# Patient Record
Sex: Female | Born: 1940 | Race: White | Hispanic: No | Marital: Married | Smoking: Never smoker
Health system: Southern US, Community
[De-identification: ages and names within clinical notes are randomized; demographics above are authoritative.]

## PROBLEM LIST (undated history)

## (undated) DIAGNOSIS — E785 Hyperlipidemia, unspecified: Secondary | ICD-10-CM

## (undated) DIAGNOSIS — T753XXA Motion sickness, initial encounter: Secondary | ICD-10-CM

## (undated) DIAGNOSIS — C801 Malignant (primary) neoplasm, unspecified: Secondary | ICD-10-CM

## (undated) DIAGNOSIS — F0280 Dementia in other diseases classified elsewhere without behavioral disturbance: Secondary | ICD-10-CM

## (undated) DIAGNOSIS — F0390 Unspecified dementia without behavioral disturbance: Secondary | ICD-10-CM

## (undated) DIAGNOSIS — E871 Hypo-osmolality and hyponatremia: Secondary | ICD-10-CM

## (undated) DIAGNOSIS — M1990 Unspecified osteoarthritis, unspecified site: Secondary | ICD-10-CM

## (undated) DIAGNOSIS — G250 Essential tremor: Secondary | ICD-10-CM

## (undated) DIAGNOSIS — I10 Essential (primary) hypertension: Secondary | ICD-10-CM

## (undated) DIAGNOSIS — M419 Scoliosis, unspecified: Secondary | ICD-10-CM

## (undated) HISTORY — PX: FOOT SURGERY: SHX648

## (undated) HISTORY — PX: COLONOSCOPY: SHX174

---

## 2004-08-11 ENCOUNTER — Ambulatory Visit: Payer: Self-pay | Admitting: Family Medicine

## 2005-11-12 ENCOUNTER — Ambulatory Visit: Payer: Self-pay | Admitting: Family Medicine

## 2005-12-08 ENCOUNTER — Ambulatory Visit: Payer: Self-pay | Admitting: Gastroenterology

## 2006-11-18 ENCOUNTER — Ambulatory Visit: Payer: Self-pay | Admitting: Family Medicine

## 2007-11-21 ENCOUNTER — Ambulatory Visit: Payer: Self-pay | Admitting: Family Medicine

## 2008-12-06 ENCOUNTER — Ambulatory Visit: Payer: Self-pay | Admitting: Family Medicine

## 2009-12-12 ENCOUNTER — Ambulatory Visit: Payer: Self-pay | Admitting: Family Medicine

## 2010-12-16 ENCOUNTER — Ambulatory Visit: Payer: Self-pay

## 2011-12-17 ENCOUNTER — Ambulatory Visit: Payer: Self-pay | Admitting: Family Medicine

## 2012-07-20 ENCOUNTER — Ambulatory Visit: Payer: Self-pay | Admitting: Ophthalmology

## 2012-07-20 HISTORY — PX: CATARACT EXTRACTION W/ INTRAOCULAR LENS IMPLANT: SHX1309

## 2013-05-10 ENCOUNTER — Ambulatory Visit: Payer: Self-pay | Admitting: Internal Medicine

## 2013-11-09 ENCOUNTER — Ambulatory Visit: Payer: Self-pay | Admitting: Family Medicine

## 2013-11-17 ENCOUNTER — Ambulatory Visit: Payer: Self-pay | Admitting: Family Medicine

## 2013-12-19 ENCOUNTER — Ambulatory Visit: Payer: Self-pay | Admitting: Family Medicine

## 2019-12-14 ENCOUNTER — Other Ambulatory Visit: Payer: Self-pay | Admitting: Family Medicine

## 2019-12-14 DIAGNOSIS — M85852 Other specified disorders of bone density and structure, left thigh: Secondary | ICD-10-CM

## 2020-01-23 ENCOUNTER — Encounter: Payer: Self-pay | Admitting: Ophthalmology

## 2020-01-23 ENCOUNTER — Other Ambulatory Visit: Payer: Self-pay

## 2020-01-29 ENCOUNTER — Other Ambulatory Visit: Admission: RE | Admit: 2020-01-29 | Payer: Medicare Other | Source: Ambulatory Visit

## 2020-01-29 NOTE — Discharge Instructions (Signed)

## 2020-01-31 ENCOUNTER — Ambulatory Visit: Payer: Medicare Other | Admitting: Anesthesiology

## 2020-01-31 ENCOUNTER — Encounter
Admission: RE | Disposition: A | Discharge status: Home / Self Care | Payer: Self-pay | Source: Home / Self Care | Attending: Ophthalmology

## 2020-01-31 ENCOUNTER — Encounter: Payer: Self-pay | Admitting: Ophthalmology

## 2020-01-31 ENCOUNTER — Ambulatory Visit
Admission: RE | Admit: 2020-01-31 | Discharge: 2020-01-31 | Disposition: A | Discharge status: Home / Self Care | Payer: Medicare Other | Attending: Ophthalmology | Admitting: Ophthalmology

## 2020-01-31 ENCOUNTER — Other Ambulatory Visit: Payer: Self-pay

## 2020-01-31 DIAGNOSIS — Z79899 Other long term (current) drug therapy: Secondary | ICD-10-CM | POA: Insufficient documentation

## 2020-01-31 DIAGNOSIS — I10 Essential (primary) hypertension: Secondary | ICD-10-CM | POA: Diagnosis not present

## 2020-01-31 DIAGNOSIS — H2511 Age-related nuclear cataract, right eye: Secondary | ICD-10-CM | POA: Insufficient documentation

## 2020-01-31 DIAGNOSIS — Z7982 Long term (current) use of aspirin: Secondary | ICD-10-CM | POA: Diagnosis not present

## 2020-01-31 DIAGNOSIS — M810 Age-related osteoporosis without current pathological fracture: Secondary | ICD-10-CM | POA: Insufficient documentation

## 2020-01-31 DIAGNOSIS — E7800 Pure hypercholesterolemia, unspecified: Secondary | ICD-10-CM | POA: Diagnosis not present

## 2020-01-31 HISTORY — DX: Scoliosis, unspecified: M41.9

## 2020-01-31 HISTORY — DX: Essential (primary) hypertension: I10

## 2020-01-31 HISTORY — DX: Hyperlipidemia, unspecified: E78.5

## 2020-01-31 HISTORY — DX: Motion sickness, initial encounter: T75.3XXA

## 2020-01-31 HISTORY — DX: Essential tremor: G25.0

## 2020-01-31 HISTORY — PX: CATARACT EXTRACTION W/PHACO: SHX586

## 2020-01-31 SURGERY — PHACOEMULSIFICATION, CATARACT, WITH IOL INSERTION
Anesthesia: Monitor Anesthesia Care | Site: Eye | Laterality: Right

## 2020-01-31 MED ORDER — EPINEPHRINE PF 1 MG/ML IJ SOLN
INTRAOCULAR | Status: DC | PRN
  Administered 2020-01-31: 62 mL via OPHTHALMIC

## 2020-01-31 MED ORDER — MIDAZOLAM HCL 2 MG/2ML IJ SOLN
INTRAMUSCULAR | Status: DC | PRN
  Administered 2020-01-31 (×2): .5 mg via INTRAVENOUS

## 2020-01-31 MED ORDER — BRIMONIDINE TARTRATE-TIMOLOL 0.2-0.5 % OP SOLN
OPHTHALMIC | Status: DC | PRN
  Administered 2020-01-31: 1 [drp] via OPHTHALMIC

## 2020-01-31 MED ORDER — TETRACAINE HCL 0.5 % OP SOLN
1.0000 [drp] | OPHTHALMIC | Status: DC | PRN
  Administered 2020-01-31 (×3): 1 [drp] via OPHTHALMIC

## 2020-01-31 MED ORDER — CEFUROXIME OPHTHALMIC INJECTION 1 MG/0.1 ML
INJECTION | OPHTHALMIC | Status: DC | PRN
  Administered 2020-01-31: 0.1 mL via INTRACAMERAL

## 2020-01-31 MED ORDER — ARMC OPHTHALMIC DILATING DROPS
1.0000 "application " | OPHTHALMIC | Status: DC | PRN
  Administered 2020-01-31 (×3): 1 via OPHTHALMIC

## 2020-01-31 MED ORDER — LIDOCAINE HCL (PF) 2 % IJ SOLN
INTRAOCULAR | Status: DC | PRN
  Administered 2020-01-31: 2 mL

## 2020-01-31 MED ORDER — MOXIFLOXACIN HCL 0.5 % OP SOLN
1.0000 [drp] | OPHTHALMIC | Status: DC | PRN
  Administered 2020-01-31 (×3): 1 [drp] via OPHTHALMIC

## 2020-01-31 MED ORDER — FENTANYL CITRATE (PF) 100 MCG/2ML IJ SOLN
INTRAMUSCULAR | Status: DC | PRN
  Administered 2020-01-31: 50 ug via INTRAVENOUS

## 2020-01-31 MED ORDER — NA HYALUR & NA CHOND-NA HYALUR 0.4-0.35 ML IO KIT
PACK | INTRAOCULAR | Status: DC | PRN
  Administered 2020-01-31: 1 mL via INTRAOCULAR

## 2020-01-31 SURGICAL SUPPLY — 23 items
CANNULA ANT/CHMB 27G (MISCELLANEOUS) ×1 IMPLANT
CANNULA ANT/CHMB 27GA (MISCELLANEOUS) ×3 IMPLANT
GLOVE SURG LX 7.5 STRW (GLOVE) ×2
GLOVE SURG LX STRL 7.5 STRW (GLOVE) ×1 IMPLANT
GLOVE SURG TRIUMPH 8.0 PF LTX (GLOVE) ×3 IMPLANT
GOWN STRL REUS W/ TWL LRG LVL3 (GOWN DISPOSABLE) ×2 IMPLANT
GOWN STRL REUS W/TWL LRG LVL3 (GOWN DISPOSABLE) ×4
LENS IOL DIOP 21.0 (Intraocular Lens) ×3 IMPLANT
LENS IOL TECNIS MONO 21.0 (Intraocular Lens) IMPLANT
MARKER SKIN DUAL TIP RULER LAB (MISCELLANEOUS) ×3 IMPLANT
NDL CAPSULORHEX 25GA (NEEDLE) ×1 IMPLANT
NDL FILTER BLUNT 18X1 1/2 (NEEDLE) ×2 IMPLANT
NEEDLE CAPSULORHEX 25GA (NEEDLE) ×3 IMPLANT
NEEDLE FILTER BLUNT 18X 1/2SAF (NEEDLE) ×4
NEEDLE FILTER BLUNT 18X1 1/2 (NEEDLE) ×2 IMPLANT
PACK CATARACT BRASINGTON (MISCELLANEOUS) ×3 IMPLANT
PACK EYE AFTER SURG (MISCELLANEOUS) ×3 IMPLANT
PACK OPTHALMIC (MISCELLANEOUS) ×3 IMPLANT
SOLUTION OPHTHALMIC SALT (MISCELLANEOUS) ×3 IMPLANT
SYR 3ML LL SCALE MARK (SYRINGE) ×6 IMPLANT
SYR TB 1ML LUER SLIP (SYRINGE) ×3 IMPLANT
WATER STERILE IRR 250ML POUR (IV SOLUTION) ×3 IMPLANT
WIPE NON LINTING 3.25X3.25 (MISCELLANEOUS) ×3 IMPLANT

## 2020-01-31 NOTE — Op Note (Signed)
LOCATION:  [facility]   PREOPERATIVE DIAGNOSIS:    Nuclear sclerotic cataract right eye. H25.11   POSTOPERATIVE DIAGNOSIS:  Nuclear sclerotic cataract right eye.     PROCEDURE:  Phacoemusification with posterior chamber intraocular lens placement of the right eye   ULTRASOUND TIME: Procedure(s): CATARACT EXTRACTION PHACO AND INTRAOCULAR LENS PLACEMENT (IOC) RIGHT 5.76 00:50.2 11.5% (Right)  LENS:   Implant Name Type Inv. Item Serial No. Manufacturer Lot No. LRB No. Used Action  LENS IOL DIOP 21.0 - [id number] Intraocular Lens LENS IOL DIOP 21.0 [id number] [employer]  Right 1 Implanted         SURGEON:  Deirdre Evener, MD   ANESTHESIA:  Topical with tetracaine drops and 2% Xylocaine jelly, augmented with 1% preservative-free intracameral lidocaine.    COMPLICATIONS:  None.   DESCRIPTION OF PROCEDURE:  The patient was identified in the holding room and transported to the operating room and placed in the supine position under the operating microscope.  The right eye was identified as the operative eye and it was prepped and draped in the usual sterile ophthalmic fashion.   A 1 millimeter clear-corneal paracentesis was made at the 12:00 position.  0.5 ml of preservative-free 1% lidocaine was injected into the anterior chamber. The anterior chamber was filled with Viscoat viscoelastic.  A 2.4 millimeter keratome was used to make a near-clear corneal incision at the 9:00 position.  A curvilinear capsulorrhexis was made with a cystotome and capsulorrhexis forceps.  Balanced salt solution was used to hydrodissect and hydrodelineate the nucleus.   Phacoemulsification was then used in stop and chop fashion to remove the lens nucleus and epinucleus.  The remaining cortex was then removed using the irrigation and aspiration handpiece. Provisc was then placed into the capsular bag to distend it for lens placement.  A lens was then injected into the capsular bag.   The remaining viscoelastic was aspirated.   Wounds were hydrated with balanced salt solution.  The anterior chamber was inflated to a physiologic pressure with balanced salt solution.  No wound leaks were noted. Cefuroxime 0.1 ml of a 10mg /ml solution was injected into the anterior chamber for a dose of 1 mg of intracameral antibiotic at the completion of the case.   Timolol and Brimonidine drops were applied to the eye.  The patient was taken to the recovery room in stable condition without complications of anesthesia or surgery.   Nainika Newlun 01/31/2020, 9:33 AM

## 2020-01-31 NOTE — Anesthesia Procedure Notes (Signed)
Procedure Name: MAC Date/Time: 01/31/2020 9:15 AM Performed by: Silvana Newness, CRNA Pre-anesthesia Checklist: Patient identified, Emergency Drugs available, Suction available, Patient being monitored and Timeout performed Patient Re-evaluated:Patient Re-evaluated prior to induction Oxygen Delivery Method: Nasal cannula

## 2020-01-31 NOTE — H&P (Signed)

## 2020-01-31 NOTE — Anesthesia Postprocedure Evaluation (Signed)
Anesthesia Post Note  Patient: Cassie Taylor  Procedure(s) Performed: CATARACT EXTRACTION PHACO AND INTRAOCULAR LENS PLACEMENT (IOC) RIGHT 5.76 00:50.2 11.5% (Right Eye)     Patient location during evaluation: PACU Anesthesia Type: MAC Level of consciousness: awake and alert Pain management: pain level controlled Vital Signs Assessment: post-procedure vital signs reviewed and stable Respiratory status: spontaneous breathing, nonlabored ventilation, respiratory function stable and patient connected to nasal cannula oxygen Cardiovascular status: stable and blood pressure returned to baseline Postop Assessment: no apparent nausea or vomiting Anesthetic complications: no   No complications documented.  Adele Barthel Aalani Aikens

## 2020-01-31 NOTE — Anesthesia Preprocedure Evaluation (Signed)
Anesthesia Evaluation  Patient identified by MRN, date of birth, ID band Patient awake    History of Anesthesia Complications Negative for: history of anesthetic complications  Airway Mallampati: II  TM Distance: >3 FB Neck ROM: Full    Dental no notable dental hx.    Pulmonary neg pulmonary ROS,    Pulmonary exam normal        Cardiovascular hypertension, Normal cardiovascular exam     Neuro/Psych    GI/Hepatic negative GI ROS, Neg liver ROS,   Endo/Other  negative endocrine ROS  Renal/GU negative Renal ROS     Musculoskeletal   Abdominal   Peds  Hematology negative hematology ROS (+)   Anesthesia Other Findings   Reproductive/Obstetrics                            Anesthesia Physical Anesthesia Plan  ASA: II  Anesthesia Plan: MAC   Post-op Pain Management:    Induction:   PONV Risk Score and Plan: 2 and Midazolam, TIVA and Treatment may vary due to age or medical condition  Airway Management Planned: Nasal Cannula and Natural Airway  Additional Equipment: None  Intra-op Plan:   Post-operative Plan:   Informed Consent: I have reviewed the patients History and Physical, chart, labs and discussed the procedure including the risks, benefits and alternatives for the proposed anesthesia with the patient or authorized representative who has indicated his/her understanding and acceptance.       Plan Discussed with: CRNA  Anesthesia Plan Comments:         Anesthesia Quick Evaluation

## 2020-01-31 NOTE — Transfer of Care (Signed)
Immediate Anesthesia Transfer of Care Note  Patient: Cassie Taylor  Procedure(s) Performed: CATARACT EXTRACTION PHACO AND INTRAOCULAR LENS PLACEMENT (IOC) RIGHT MALYUGIN (Right Eye)  Patient Location: PACU  Anesthesia Type: MAC  Level of Consciousness: awake, alert  and patient cooperative  Airway and Oxygen Therapy: Patient Spontanous Breathing and Patient connected to supplemental oxygen  Post-op Assessment: Post-op Vital signs reviewed, Patient's Cardiovascular Status Stable, Respiratory Function Stable, Patent Airway and No signs of Nausea or vomiting  Post-op Vital Signs: Reviewed and stable  Complications: No complications documented.

## 2021-01-30 ENCOUNTER — Encounter: Payer: Self-pay | Admitting: Emergency Medicine

## 2021-01-30 ENCOUNTER — Ambulatory Visit
Admission: EM | Admit: 2021-01-30 | Discharge: 2021-01-30 | Disposition: A | Discharge status: Home / Self Care | Payer: Medicare Other | Attending: Sports Medicine | Admitting: Sports Medicine

## 2021-01-30 ENCOUNTER — Other Ambulatory Visit: Payer: Self-pay

## 2021-01-30 DIAGNOSIS — R42 Dizziness and giddiness: Secondary | ICD-10-CM

## 2021-01-30 DIAGNOSIS — H8113 Benign paroxysmal vertigo, bilateral: Secondary | ICD-10-CM | POA: Diagnosis not present

## 2021-01-30 MED ORDER — MECLIZINE HCL 25 MG PO TABS: 25.0000 mg | ORAL_TABLET | Freq: Three times a day (TID) | ORAL | 0 refills | Status: DC | PRN

## 2021-01-30 NOTE — ED Provider Notes (Signed)
[facility]    CSN: [id number] Arrival date & time: 01/30/21  0846      History   Chief Complaint Chief Complaint  Patient presents with   Dizziness    HPI Cassie Taylor is a 80 y.o. female.   80 year old female who presents with her daughter-in-law for evaluation of the above issue.  Normally goes to [facility] but could not get in today.  She has a history of hypertension and hypercholesterolemia.  Otherwise she is fairly healthy.  She said that when she woke up this morning she was dizzy and when she sat up she was a little bit more dizzy.  She does have a remote history of similar symptoms but it was years ago and she cannot really remember her presentation.  She denies any ear pain or fluid in her ears.  No neck issues.  She does report a little bit of mild nausea but no emesis.  She did eat breakfast this morning.  No chest pain or shortness of breath.  No palpitations.  No COVID symptoms or COVID exposure.  No red flag signs or symptoms elicited on history.   Past Medical History:  Diagnosis Date   Benign head tremor    Hyperlipidemia    Hypertension    Motion sickness    cars   Scoliosis    lower back    There are no problems to display for this patient.   Past Surgical History:  Procedure Laterality Date   CATARACT EXTRACTION W/ INTRAOCULAR LENS IMPLANT Left 07/20/2012   [facility] - Dr Inez Pilgrim   CATARACT EXTRACTION W/PHACO Right 01/31/2020   Procedure: CATARACT EXTRACTION PHACO AND INTRAOCULAR LENS PLACEMENT (IOC) RIGHT 5.76 00:50.2 11.5%;  Surgeon: Lockie Mola, MD;  Location: [facility];  Service: Ophthalmology;  Laterality: Right;   COLONOSCOPY     FOOT SURGERY      OB History   No obstetric history on file.      Home Medications    Prior to Admission medications   Medication Sig Start Date End Date Taking? Authorizing Provider  aspirin 325 MG tablet Take 325 mg by mouth daily.   Yes [provider]  atorvastatin (LIPITOR) 10 MG tablet Take 10 mg by mouth daily.   Yes [provider]  CALCIUM CITRATE PO Take 600 mg by mouth in the morning and at bedtime.   Yes [provider]  diltiazem (DILACOR XR) 240 MG 24 hr capsule Take 240 mg by mouth daily.   Yes [provider]  Ferrous Sulfate (IRON) 325 (65 Fe) MG TABS Take by mouth daily.   Yes [provider]  meclizine (ANTIVERT) 25 MG tablet Take 1 tablet (25 mg total) by mouth 3 (three) times daily as needed for dizziness. 01/30/21  Yes Delton See, MD  Multiple Vitamin (MULTIVITAMIN) tablet Take 1 tablet by mouth daily.   Yes [provider]  Omega-3 1000 MG CAPS Take by mouth daily.   Yes [provider]    Family History History reviewed. No pertinent family history.  Social History Social History   Tobacco Use   Smoking status: Never   Smokeless tobacco: Never  Vaping Use   Vaping Use: Never used  Substance Use Topics   Alcohol use: Yes    Alcohol/week: 7.0 standard drinks    Types: 7 Cans of beer per week     Allergies   Codeine   Review of Systems Review of Systems  Constitutional:  Positive for activity change. Negative for appetite change, chills, diaphoresis, fatigue and fever.  HENT:  Negative for congestion, ear pain, postnasal drip, rhinorrhea, sinus pressure, sinus pain, sneezing and sore throat.   Eyes:  Negative for photophobia, pain and visual disturbance.  Respiratory:  Negative for cough, chest tightness and shortness of breath.   Cardiovascular:  Negative for chest pain and palpitations.  Gastrointestinal:  Positive for nausea. Negative for abdominal pain, diarrhea and vomiting.  Genitourinary:  Negative for dysuria.  Musculoskeletal:  Negative for back pain, myalgias and neck pain.  Skin:  Negative for color change, pallor, rash and wound.  Neurological:  Positive for dizziness and light-headedness. Negative for seizures, syncope, facial  asymmetry, speech difficulty, weakness, numbness and headaches.  All other systems reviewed and are negative.   Physical Exam Triage Vital Signs ED Triage Vitals  Enc Vitals Group     BP 01/30/21 0858 (!) 164/80     Pulse Rate 01/30/21 0858 69     Resp 01/30/21 0858 18     Temp 01/30/21 0858 98.7 F (37.1 C)     Temp Source 01/30/21 0858 Oral     SpO2 01/30/21 0858 96 %     Weight 01/30/21 0856 100 lb 15.5 oz (45.8 kg)     Height 01/30/21 0856 5\' 2"  (1.575 m)     Head Circumference --      Peak Flow --      Pain Score 01/30/21 0855 0     Pain Loc --      Pain Edu? --      Excl. in GC? --    No data found.  Updated Vital Signs BP (!) 164/80 (BP Location: Left Arm)   Pulse 69   Temp 98.7 F (37.1 C) (Oral)   Resp 18   Ht 5\' 2"  (1.575 m)   Wt 45.8 kg   SpO2 96%   BMI 18.47 kg/m   Visual Acuity Right Eye Distance:   Left Eye Distance:   Bilateral Distance:    Right Eye Near:   Left Eye Near:    Bilateral Near:     Physical Exam Vitals and nursing note reviewed.  Constitutional:      General: She is not in acute distress.    Appearance: Normal appearance. She is not ill-appearing, toxic-appearing or diaphoretic.  HENT:     Head: Normocephalic and atraumatic.     Right Ear: Tympanic membrane normal.     Left Ear: Tympanic membrane normal.     Nose: Nose normal. No congestion or rhinorrhea.     Mouth/Throat:     Mouth: Mucous membranes are moist.  Eyes:     General: No scleral icterus.       Right eye: No discharge.        Left eye: No discharge.     Extraocular Movements: Extraocular movements intact.     Conjunctiva/sclera: Conjunctivae normal.     Pupils: Pupils are equal, round, and reactive to light.  Cardiovascular:     Rate and Rhythm: Normal rate and regular rhythm.     Chest Wall: PMI is not displaced. No thrill.     Pulses: Normal pulses. No decreased pulses.     Heart sounds: Normal heart sounds. No murmur heard. No systolic murmur is  present.  No diastolic murmur is present.    No friction rub. No gallop. No S3 or S4 sounds.  Pulmonary:     Effort: Pulmonary effort is normal.  Breath sounds: Normal breath sounds. No stridor. No wheezing, rhonchi or rales.  Musculoskeletal:     Cervical back: Normal range of motion and neck supple. No rigidity or tenderness.     Right lower leg: No edema.     Left lower leg: No edema.  Lymphadenopathy:     Cervical: No cervical adenopathy.  Skin:    General: Skin is warm and dry.     Capillary Refill: Capillary refill takes less than 2 seconds.  Neurological:     General: No focal deficit present.     Mental Status: She is alert and oriented to person, place, and time.     GCS: GCS eye subscore is 4. GCS verbal subscore is 5. GCS motor subscore is 6.     Cranial Nerves: Cranial nerves are intact.     Sensory: Sensation is intact.     Motor: Motor function is intact.     Coordination: Coordination is intact.     UC Treatments / Results  Labs (all labs ordered are listed, but only abnormal results are displayed) Labs Reviewed - No data to display  EKG Twelve-lead EKG ordered and interpreted in the office today and independently reviewed.  January 30, 2021 at 9:06 AM shows normal sinus rhythm possible minimal atrial enlargement.  Ventricular rate of 67 bpm.  PR interval of 150 ms.  QRS duration 88 ms.  QT and QTc of 408 and 431 ms.  No acute ST or T wave changes noted.  Radiology No results found.  Procedures Procedures (including critical care time)  Medications Ordered in UC Medications - No data to display  Initial Impression / Assessment and Plan / UC Course  I have reviewed the triage vital signs and the nursing notes.  Pertinent labs & imaging results that were available during my care of the patient were reviewed by me and considered in my medical decision making (see chart for details).  Clinical impression: Dizziness that she woke up with this morning.  Exam  seems consistent with benign positional vertigo.  She also has some lightheadedness.  She is not having any cardiac issues.  No inner ear issues as well.  Cardiac and neurological exam as well as vital signs are reassuring.  Treatment plan: 1.  The findings and treatment plan were discussed in detail with the patient.  Patient was in agreement. 2.  Recommended getting an EKG.  It was ordered and interpreted by myself and independently reviewed.  Please see results above.  No acute ST or T wave changes. 3.  I believe she has benign positional vertigo and we will treat her and see if she gets better with some meclizine.  Sent to her pharmacy. 4.  Discussed making sure that she stays properly hydrated with this heat and humidity and drink lots of water.  She voiced verbal understanding. 5.  Educational handouts provided. 6.  I do want her to follow-up with her primary care provider next week and make sure that she is going in the right direction.  Or coming into long weekend so if her symptoms are not improving or worsening she will need to call 911 and go to the ER. 7.  She is stable on discharge and she will follow-up here as needed.    Final Clinical Impressions(s) / UC Diagnoses   Final diagnoses:  Dizziness  BPV (benign positional vertigo), bilateral  Lightheadedness     Discharge Instructions      As we discussed, it appears  as though you have a condition called benign positional vertigo.  Please see educational handouts. As we discussed, make sure you keep up on your fluids including plenty of water to make sure that there is no dehydration with the hot humid weather. Your EKG did not show any significant changes concerning for cardiac issue.  You are not having any chest pain or shortness of breath. If your symptoms were to change or worsen in any way, please call 911 and be evaluated in a ER facility. It would be a good idea for you to get into see your primary care provider next  week just for recheck to make sure that you are getting better. I sent in some medication for your vertigo.  You get that at your pharmacy. Follow-up here as needed.     ED Prescriptions     Medication Sig Dispense Auth. Provider   meclizine (ANTIVERT) 25 MG tablet Take 1 tablet (25 mg total) by mouth 3 (three) times daily as needed for dizziness. 30 tablet Delton SeeBarnes, Ramsey Midgett, MD      PDMP not reviewed this encounter.   Delton SeeBarnes, Danija Gosa, MD 01/30/21 1300

## 2021-01-30 NOTE — ED Triage Notes (Signed)
Pt c/o dizziness. Started this morning. She was laying in bed and got worse when she stood up. Denies chest pain, shortness of breath or palpitations.

## 2021-01-30 NOTE — Discharge Instructions (Addendum)
As we discussed, it appears as though you have a condition called benign positional vertigo.  Please see educational handouts. As we discussed, make sure you keep up on your fluids including plenty of water to make sure that there is no dehydration with the hot humid weather. Your EKG did not show any significant changes concerning for cardiac issue.  You are not having any chest pain or shortness of breath. If your symptoms were to change or worsen in any way, please call 911 and be evaluated in a ER facility. It would be a good idea for you to get into see your primary care provider next week just for recheck to make sure that you are getting better. I sent in some medication for your vertigo.  You get that at your pharmacy. Follow-up here as needed.

## 2021-02-21 ENCOUNTER — Encounter: Payer: Self-pay | Admitting: Emergency Medicine

## 2021-02-21 ENCOUNTER — Other Ambulatory Visit: Payer: Self-pay

## 2021-02-21 ENCOUNTER — Ambulatory Visit
Admission: EM | Admit: 2021-02-21 | Discharge: 2021-02-21 | Disposition: A | Discharge status: Home / Self Care | Payer: Medicare Other | Attending: Sports Medicine | Admitting: Sports Medicine

## 2021-02-21 DIAGNOSIS — L539 Erythematous condition, unspecified: Secondary | ICD-10-CM | POA: Diagnosis not present

## 2021-02-21 DIAGNOSIS — S80811A Abrasion, right lower leg, initial encounter: Secondary | ICD-10-CM

## 2021-02-21 MED ORDER — CEPHALEXIN 500 MG PO CAPS: 500.0000 mg | ORAL_CAPSULE | Freq: Three times a day (TID) | ORAL | 0 refills | Status: DC

## 2021-02-21 NOTE — ED Provider Notes (Signed)
[facility]    CSN: [id number] Arrival date & time: 02/21/21  1121      History   Chief Complaint Chief Complaint  Patient presents with   Abrasion    HPI Cassie Taylor is a 80 y.o. female.   80 year old female who presents for evaluation of a persistent lesion on her right lower extremity.  She said that she hit it on the door of the dishwasher about 3 to 4 weeks ago.  It did bleed at the time.  Seem to be healing quite nicely, however she is noted some redness around that area and a little bit increased pain so wanted to be seen today.  She noticed that this morning.  Normally goes to [facility] but could not get an appointment today.  She does say that she was at her primary care physician's office recently and mentioned that but they just told her to do warm compresses over that area.  Given the fact is not improving and is actually worse she comes in today for initial evaluation.  She denies any fever shakes chills.  She denies any history of skin cancer or any other significant lesions.  She denies any calf pain.  She does take a baby aspirin daily.  She has no history of DVTs or PEs.  She denies any calf pain.  No red flag signs or symptoms elicited on history.   Past Medical History:  Diagnosis Date   Benign head tremor    Hyperlipidemia    Hypertension    Motion sickness    cars   Scoliosis    lower back    There are no problems to display for this patient.   Past Surgical History:  Procedure Laterality Date   CATARACT EXTRACTION W/ INTRAOCULAR LENS IMPLANT Left 07/20/2012   [facility] - Dr Inez PilgrimBrasington   CATARACT EXTRACTION W/PHACO Right 01/31/2020   Procedure: CATARACT EXTRACTION PHACO AND INTRAOCULAR LENS PLACEMENT (IOC) RIGHT 5.76 00:50.2 11.5%;  Surgeon: Lockie MolaBrasington, Chadwick, MD;  Location[facility];  Service: Ophthalmology;  Laterality: Right;   COLONOSCOPY     FOOT SURGERY      OB History   No obstetric history on file.       Home Medications    Prior to Admission medications   Medication Sig Start Date End Date Taking? Authorizing Provider  Aspirin 81 MG CAPS Take 325 mg by mouth daily.   Yes [provider]  atorvastatin (LIPITOR) 10 MG tablet Take 10 mg by mouth daily.   Yes [provider]  CALCIUM CITRATE PO Take 600 mg by mouth in the morning and at bedtime.   Yes [provider]  cephALEXin (KEFLEX) 500 MG capsule Take 1 capsule (500 mg total) by mouth 3 (three) times daily. 02/21/21  Yes Delton SeeBarnes, Avalie Oconnor, MD  diltiazem (DILACOR XR) 240 MG 24 hr capsule Take 240 mg by mouth daily.   Yes [provider]  Ferrous Sulfate (IRON) 325 (65 Fe) MG TABS Take by mouth daily.   Yes [provider]  Multiple Vitamin (MULTIVITAMIN) tablet Take 1 tablet by mouth daily.   Yes [provider]  Omega-3 1000 MG CAPS Take by mouth daily.   Yes [provider]  meclizine (ANTIVERT) 25 MG tablet Take 1 tablet (25 mg total) by mouth 3 (three) times daily as needed for dizziness. 01/30/21   Delton SeeBarnes, Tiye Huwe, MD    Family History No family history on file.  Social History Social History  Tobacco Use   Smoking status: Never   Smokeless tobacco: Never  Vaping Use   Vaping Use: Never used  Substance Use Topics   Alcohol use: Yes    Alcohol/week: 7.0 standard drinks    Types: 7 Cans of beer per week   Drug use: Not Currently     Allergies   Codeine   Review of Systems Review of Systems  Constitutional:  Negative for activity change, appetite change, chills, diaphoresis, fatigue and fever.  HENT:  Negative for congestion, ear pain, postnasal drip, rhinorrhea, sinus pressure, sinus pain, sneezing and sore throat.   Eyes:  Negative for pain.  Respiratory:  Negative for cough, chest tightness and shortness of breath.   Cardiovascular:  Negative for chest pain and palpitations.  Gastrointestinal:  Negative for abdominal pain, diarrhea, nausea and  vomiting.  Genitourinary:  Negative for dysuria.  Musculoskeletal:  Negative for arthralgias, back pain, gait problem, myalgias and neck pain.  Skin:  Positive for color change and wound. Negative for pallor.  Neurological:  Negative for dizziness, syncope, light-headedness, numbness and headaches.  All other systems reviewed and are negative.   Physical Exam Triage Vital Signs ED Triage Vitals  Enc Vitals Group     BP 02/21/21 1145 117/64     Pulse Rate 02/21/21 1145 65     Resp 02/21/21 1145 18     Temp 02/21/21 1145 98.5 F (36.9 C)     Temp Source 02/21/21 1145 Oral     SpO2 02/21/21 1145 99 %     Weight 02/21/21 1142 100 lb 15.5 oz (45.8 kg)     Height 02/21/21 1142 5\' 2"  (1.575 m)     Head Circumference --      Peak Flow --      Pain Score 02/21/21 1142 3     Pain Loc --      Pain Edu? --      Excl. in GC? --    No data found.  Updated Vital Signs BP 117/64 (BP Location: Right Arm)   Pulse 65   Temp 98.5 F (36.9 C) (Oral)   Resp 18   Ht 5\' 2"  (1.575 m)   Wt 45.8 kg   SpO2 99%   BMI 18.47 kg/m   Visual Acuity Right Eye Distance:   Left Eye Distance:   Bilateral Distance:    Right Eye Near:   Left Eye Near:    Bilateral Near:     Physical Exam Vitals and nursing note reviewed.  Constitutional:      General: She is not in acute distress.    Appearance: Normal appearance. She is not ill-appearing, toxic-appearing or diaphoretic.  HENT:     Head: Normocephalic and atraumatic.     Nose: Nose normal.     Mouth/Throat:     Mouth: Mucous membranes are moist.  Eyes:     Conjunctiva/sclera: Conjunctivae normal.     Pupils: Pupils are equal, round, and reactive to light.  Cardiovascular:     Rate and Rhythm: Normal rate and regular rhythm.     Pulses: Normal pulses.     Heart sounds: Normal heart sounds. No murmur heard.   No friction rub. No gallop.  Pulmonary:     Effort: Pulmonary effort is normal.     Breath sounds: Normal breath sounds. No  stridor. No wheezing, rhonchi or rales.  Musculoskeletal:     Cervical back: Normal range of motion and neck supple.  Skin:    General:  Skin is warm and dry.     Capillary Refill: Capillary refill takes less than 2 seconds.     Coloration: Skin is not jaundiced.     Findings: Abrasion, erythema, lesion and wound present. No abscess, bruising, ecchymosis or rash.     Comments: Distal right lateral leg: There is a raised callus-like lesion about the circumference of a dime.  There is some erythema around the lesion and it is minimally tender to palpation.  There is no streaking or phlebitis.  No active drainage.  No induration or fluctuance.  No tenderness in the calf.  [place] tests are negative.  Neurological:     General: No focal deficit present.     Mental Status: She is alert and oriented to person, place, and time.     UC Treatments / Results  Labs (all labs ordered are listed, but only abnormal results are displayed) Labs Reviewed - No data to display  EKG   Radiology No results found.  Procedures Procedures (including critical care time)  Medications Ordered in UC Medications - No data to display  Initial Impression / Assessment and Plan / UC Course  I have reviewed the triage vital signs and the nursing notes.  Pertinent labs & imaging results that were available during my care of the patient were reviewed by me and considered in my medical decision making (see chart for details).  Clinical impression: Right lower leg abrasion now 3 to 4 weeks since the injury.  She has developed some erythema and some discomfort this morning.  There is a raised callus-like lesion overlying the erythema.  We will treat for a potential infection.  Treatment plan: 1.  The findings and treatment plan were discussed in detail with the patient.  Patient was in agreement. 2.  We will treat her with Keflex, and given her weight we will treat her 3 times daily for 1 week. 3.  I  have recommended she get back into her primary care physician's office next week for recheck and make sure she is going in the right direction.  Given the nature of this lesion she may need dermatology to have a look at this.  I did indicate that it is possible that hitting it on a dishwasher may be a red herring and we do not want to miss any potential skin lesion that needs dermatology evaluation.  Given her insurance I could not refer her from the urgent care. 4.  Educational handouts provided. 5.  If symptoms were to worsen between now and seeing her PCP I advised her to go to the ER.  She voiced verbal understanding. 6.  She was stable upon discharge and will follow-up here as needed.    Final Clinical Impressions(s) / UC Diagnoses   Final diagnoses:  Abrasion of right leg, initial encounter  Leg erythema     Discharge Instructions      As we discussed, your abrasion has a little redness and warmth around it.  I am going to cover you for potential infection. Please see your primary care provider next week and see if they feel as though you may need to see dermatology and have that lesion looked at a little closer. Please see educational handouts.     ED Prescriptions     Medication Sig Dispense Auth. Provider   cephALEXin (KEFLEX) 500 MG capsule Take 1 capsule (500 mg total) by mouth 3 (three) times daily. 21 capsule Delton See, MD  PDMP not reviewed this encounter.   Delton See, MD 02/21/21 [phone]

## 2021-02-21 NOTE — Discharge Instructions (Addendum)
As we discussed, your abrasion has a little redness and warmth around it.  I am going to cover you for potential infection. Please see your primary care provider next week and see if they feel as though you may need to see dermatology and have that lesion looked at a little closer. Please see educational handouts.

## 2021-02-21 NOTE — ED Triage Notes (Signed)
Pt has an abrasion on her right lower leg that occurred about 3 weeks ago. She states she started having more pain in the area this morning. She states it started when she bumped her leg on the dish washer door at home.

## 2023-01-27 ENCOUNTER — Other Ambulatory Visit (LOCAL_COMMUNITY_HEALTH_CENTER): Payer: Self-pay

## 2023-01-27 DIAGNOSIS — Z111 Encounter for screening for respiratory tuberculosis: Secondary | ICD-10-CM

## 2023-01-27 NOTE — Progress Notes (Signed)
In nurse clinic needing QFT.   Pt and husband are moving to an assisted living home says daughter in law who is with them today.  ROI signed and has access to [facility] chart.  Walked pt to lab  for bloodwork.  Cherlynn Polo, RN

## 2023-02-03 LAB — QUANTIFERON-TB GOLD PLUS
QuantiFERON Mitogen Value: >10 IU/mL
QuantiFERON Nil Value: 0 IU/mL
QuantiFERON TB1 Ag Value: 0.01 IU/mL
QuantiFERON TB2 Ag Value: 0.01 IU/mL
QuantiFERON-TB Gold Plus: NEGATIVE

## 2023-02-03 NOTE — Progress Notes (Signed)
Reviewed. Called daughter-in-law, as listed on ROI, to inform her of QFT negative results. Plans to print the results off of MyChart, but RN informed daughter-in-law that we can provide results to them if needed. Questions answered and verbalizes understanding. Abagail Kitchens, RN

## 2023-10-31 ENCOUNTER — Emergency Department

## 2023-10-31 ENCOUNTER — Other Ambulatory Visit: Payer: Self-pay

## 2023-10-31 ENCOUNTER — Encounter: Payer: Self-pay | Admitting: Emergency Medicine

## 2023-10-31 ENCOUNTER — Inpatient Hospital Stay

## 2023-10-31 ENCOUNTER — Inpatient Hospital Stay
Admission: EM | Admit: 2023-10-31 | Discharge: 2023-11-06 | DRG: 643 | Disposition: A | Discharge status: Nursing Home (Includes ICF) | Source: Skilled Nursing Facility | Attending: Student | Admitting: Student

## 2023-10-31 DIAGNOSIS — R1310 Dysphagia, unspecified: Secondary | ICD-10-CM | POA: Diagnosis not present

## 2023-10-31 DIAGNOSIS — Z7982 Long term (current) use of aspirin: Secondary | ICD-10-CM

## 2023-10-31 DIAGNOSIS — F0280 Dementia in other diseases classified elsewhere without behavioral disturbance: Secondary | ICD-10-CM | POA: Diagnosis present

## 2023-10-31 DIAGNOSIS — Z885 Allergy status to narcotic agent status: Secondary | ICD-10-CM

## 2023-10-31 DIAGNOSIS — E785 Hyperlipidemia, unspecified: Secondary | ICD-10-CM | POA: Diagnosis present

## 2023-10-31 DIAGNOSIS — D72825 Bandemia: Secondary | ICD-10-CM | POA: Diagnosis not present

## 2023-10-31 DIAGNOSIS — E876 Hypokalemia: Secondary | ICD-10-CM | POA: Diagnosis present

## 2023-10-31 DIAGNOSIS — G3109 Other frontotemporal dementia: Secondary | ICD-10-CM | POA: Diagnosis present

## 2023-10-31 DIAGNOSIS — R631 Polydipsia: Secondary | ICD-10-CM | POA: Diagnosis present

## 2023-10-31 DIAGNOSIS — F05 Delirium due to known physiological condition: Secondary | ICD-10-CM | POA: Diagnosis not present

## 2023-10-31 DIAGNOSIS — S066XAA Traumatic subarachnoid hemorrhage with loss of consciousness status unknown, initial encounter: Secondary | ICD-10-CM | POA: Diagnosis present

## 2023-10-31 DIAGNOSIS — Z66 Do not resuscitate: Secondary | ICD-10-CM | POA: Diagnosis not present

## 2023-10-31 DIAGNOSIS — E871 Hypo-osmolality and hyponatremia: Principal | ICD-10-CM | POA: Diagnosis present

## 2023-10-31 DIAGNOSIS — Z79899 Other long term (current) drug therapy: Secondary | ICD-10-CM

## 2023-10-31 DIAGNOSIS — Z9841 Cataract extraction status, right eye: Secondary | ICD-10-CM

## 2023-10-31 DIAGNOSIS — Z681 Body mass index (BMI) 19 or less, adult: Secondary | ICD-10-CM

## 2023-10-31 DIAGNOSIS — I10 Essential (primary) hypertension: Secondary | ICD-10-CM | POA: Diagnosis present

## 2023-10-31 DIAGNOSIS — F0390 Unspecified dementia without behavioral disturbance: Secondary | ICD-10-CM

## 2023-10-31 DIAGNOSIS — W1830XA Fall on same level, unspecified, initial encounter: Secondary | ICD-10-CM | POA: Diagnosis present

## 2023-10-31 DIAGNOSIS — S50312A Abrasion of left elbow, initial encounter: Secondary | ICD-10-CM | POA: Diagnosis present

## 2023-10-31 DIAGNOSIS — S062XAA Diffuse traumatic brain injury with loss of consciousness status unknown, initial encounter: Secondary | ICD-10-CM | POA: Diagnosis present

## 2023-10-31 DIAGNOSIS — Y92129 Unspecified place in nursing home as the place of occurrence of the external cause: Secondary | ICD-10-CM | POA: Diagnosis not present

## 2023-10-31 DIAGNOSIS — E222 Syndrome of inappropriate secretion of antidiuretic hormone: Principal | ICD-10-CM | POA: Diagnosis present

## 2023-10-31 DIAGNOSIS — Z961 Presence of intraocular lens: Secondary | ICD-10-CM | POA: Diagnosis present

## 2023-10-31 DIAGNOSIS — J189 Pneumonia, unspecified organism: Secondary | ICD-10-CM | POA: Diagnosis present

## 2023-10-31 DIAGNOSIS — R2689 Other abnormalities of gait and mobility: Secondary | ICD-10-CM | POA: Diagnosis present

## 2023-10-31 DIAGNOSIS — R682 Dry mouth, unspecified: Secondary | ICD-10-CM | POA: Diagnosis not present

## 2023-10-31 DIAGNOSIS — Z9842 Cataract extraction status, left eye: Secondary | ICD-10-CM

## 2023-10-31 DIAGNOSIS — T43205A Adverse effect of unspecified antidepressants, initial encounter: Secondary | ICD-10-CM | POA: Diagnosis present

## 2023-10-31 DIAGNOSIS — I609 Nontraumatic subarachnoid hemorrhage, unspecified: Secondary | ICD-10-CM

## 2023-10-31 DIAGNOSIS — R636 Underweight: Secondary | ICD-10-CM | POA: Diagnosis present

## 2023-10-31 DIAGNOSIS — M419 Scoliosis, unspecified: Secondary | ICD-10-CM | POA: Diagnosis present

## 2023-10-31 DIAGNOSIS — R112 Nausea with vomiting, unspecified: Secondary | ICD-10-CM | POA: Diagnosis present

## 2023-10-31 DIAGNOSIS — H409 Unspecified glaucoma: Secondary | ICD-10-CM | POA: Insufficient documentation

## 2023-10-31 DIAGNOSIS — M8580 Other specified disorders of bone density and structure, unspecified site: Secondary | ICD-10-CM | POA: Insufficient documentation

## 2023-10-31 DIAGNOSIS — R54 Age-related physical debility: Secondary | ICD-10-CM | POA: Diagnosis present

## 2023-10-31 DIAGNOSIS — R059 Cough, unspecified: Secondary | ICD-10-CM | POA: Diagnosis present

## 2023-10-31 DIAGNOSIS — Z515 Encounter for palliative care: Secondary | ICD-10-CM | POA: Diagnosis not present

## 2023-10-31 DIAGNOSIS — Z1152 Encounter for screening for COVID-19: Secondary | ICD-10-CM

## 2023-10-31 DIAGNOSIS — D72829 Elevated white blood cell count, unspecified: Secondary | ICD-10-CM

## 2023-10-31 LAB — COMPREHENSIVE METABOLIC PANEL WITH GFR
ALT: 65 U/L — ABNORMAL HIGH (ref 0–44)
AST: 49 U/L — ABNORMAL HIGH (ref 15–41)
Albumin: 2.6 g/dL — ABNORMAL LOW (ref 3.5–5.0)
Alkaline Phosphatase: 74 U/L (ref 38–126)
Anion gap: 9 (ref 5–15)
BUN: 7 mg/dL — ABNORMAL LOW (ref 8–23)
CO2: 33 mmol/L — ABNORMAL HIGH (ref 22–32)
Calcium: 8.1 mg/dL — ABNORMAL LOW (ref 8.9–10.3)
Chloride: 75 mmol/L — ABNORMAL LOW (ref 98–111)
Creatinine, Ser: 0.51 mg/dL (ref 0.44–1.00)
GFR, Estimated: >60 mL/min (ref >60)
Glucose, Bld: 102 mg/dL — ABNORMAL HIGH (ref 70–99)
Potassium: 2.7 mmol/L — CL (ref 3.5–5.1)
Sodium: 117 mmol/L — CL (ref 135–145)
Total Bilirubin: 0.8 mg/dL (ref 0.0–1.2)
Total Protein: 6.4 g/dL — ABNORMAL LOW (ref 6.5–8.1)

## 2023-10-31 LAB — URINALYSIS, W/ REFLEX TO CULTURE (INFECTION SUSPECTED)
Bacteria, UA: NONE SEEN
Bilirubin Urine: NEGATIVE
Glucose, UA: NEGATIVE mg/dL
Hgb urine dipstick: NEGATIVE
Ketones, ur: NEGATIVE mg/dL
Leukocytes,Ua: NEGATIVE
Nitrite: NEGATIVE
Protein, ur: NEGATIVE mg/dL
Specific Gravity, Urine: 1.012 (ref 1.005–1.030)
Squamous Epithelial / HPF: 0 /HPF (ref 0–5)
pH: 8 (ref 5.0–8.0)

## 2023-10-31 LAB — RESP PANEL BY RT-PCR (RSV, FLU A&B, COVID)  RVPGX2
Influenza A by PCR: NEGATIVE
Influenza B by PCR: NEGATIVE
Resp Syncytial Virus by PCR: NEGATIVE
SARS Coronavirus 2 by RT PCR: NEGATIVE

## 2023-10-31 LAB — CBC WITH DIFFERENTIAL/PLATELET
Abs Immature Granulocytes: 0.17 10*3/uL — ABNORMAL HIGH (ref 0.00–0.07)
Basophils Absolute: 0 10*3/uL (ref 0.0–0.1)
Basophils Relative: 0 %
Eosinophils Absolute: 0.1 10*3/uL (ref 0.0–0.5)
Eosinophils Relative: 0 %
HCT: 33.6 % — ABNORMAL LOW (ref 36.0–46.0)
Hemoglobin: 12 g/dL (ref 12.0–15.0)
Immature Granulocytes: 1 %
Lymphocytes Relative: 9 %
Lymphs Abs: 1.5 10*3/uL (ref 0.7–4.0)
MCH: 31.3 pg (ref 26.0–34.0)
MCHC: 35.7 g/dL (ref 30.0–36.0)
MCV: 87.5 fL (ref 80.0–100.0)
Monocytes Absolute: 1.2 10*3/uL — ABNORMAL HIGH (ref 0.1–1.0)
Monocytes Relative: 7 %
Neutro Abs: 13.1 10*3/uL — ABNORMAL HIGH (ref 1.7–7.7)
Neutrophils Relative %: 83 %
Platelets: 442 10*3/uL — ABNORMAL HIGH (ref 150–400)
RBC: 3.84 MIL/uL — ABNORMAL LOW (ref 3.87–5.11)
RDW: 12.1 % (ref 11.5–15.5)
WBC: 15.9 10*3/uL — ABNORMAL HIGH (ref 4.0–10.5)
nRBC: 0 % (ref 0.0–0.2)

## 2023-10-31 LAB — MAGNESIUM: Magnesium: 2 mg/dL (ref 1.7–2.4)

## 2023-10-31 LAB — TROPONIN I (HIGH SENSITIVITY)
Troponin I (High Sensitivity): 7 ng/L (ref <18)
Troponin I (High Sensitivity): 7 ng/L (ref <18)

## 2023-10-31 LAB — PROCALCITONIN: Procalcitonin: <0.1 ng/mL

## 2023-10-31 LAB — BASIC METABOLIC PANEL WITH GFR
Anion gap: 9 (ref 5–15)
BUN: 9 mg/dL (ref 8–23)
CO2: 25 mmol/L (ref 22–32)
Calcium: 6.9 mg/dL — ABNORMAL LOW (ref 8.9–10.3)
Chloride: 88 mmol/L — ABNORMAL LOW (ref 98–111)
Creatinine, Ser: 0.42 mg/dL — ABNORMAL LOW (ref 0.44–1.00)
GFR, Estimated: >60 mL/min (ref >60)
Glucose, Bld: 115 mg/dL — ABNORMAL HIGH (ref 70–99)
Potassium: 3.2 mmol/L — ABNORMAL LOW (ref 3.5–5.1)
Sodium: 122 mmol/L — ABNORMAL LOW (ref 135–145)

## 2023-10-31 MED ORDER — DILTIAZEM HCL ER COATED BEADS 120 MG PO CP24
240.0000 mg | ORAL_CAPSULE | Freq: Every day | ORAL | Status: DC
  Administered 2023-11-01 – 2023-11-06 (×6): 240 mg via ORAL
  Filled 2023-10-31 (×3): qty 2
  Filled 2023-10-31: qty 1
  Filled 2023-10-31 (×2): qty 2

## 2023-10-31 MED ORDER — ONDANSETRON HCL 4 MG PO TABS: 4.0000 mg | ORAL_TABLET | Freq: Four times a day (QID) | ORAL | Status: DC | PRN

## 2023-10-31 MED ORDER — POTASSIUM CHLORIDE 10 MEQ/100ML IV SOLN
10.0000 meq | INTRAVENOUS | Status: AC
  Administered 2023-10-31 (×2): 10 meq via INTRAVENOUS
  Filled 2023-10-31 (×2): qty 100

## 2023-10-31 MED ORDER — SODIUM CHLORIDE 0.9% FLUSH
3.0000 mL | Freq: Two times a day (BID) | INTRAVENOUS | Status: DC
  Administered 2023-10-31 – 2023-11-06 (×12): 3 mL via INTRAVENOUS

## 2023-10-31 MED ORDER — ATORVASTATIN CALCIUM 10 MG PO TABS
10.0000 mg | ORAL_TABLET | Freq: Every day | ORAL | Status: DC
Start: 2023-11-01 — End: 2023-11-06
  Administered 2023-11-01 – 2023-11-06 (×6): 10 mg via ORAL
  Filled 2023-10-31 (×6): qty 1

## 2023-10-31 MED ORDER — IOHEXOL 350 MG/ML SOLN
75.0000 mL | Freq: Once | INTRAVENOUS | Status: AC | PRN
  Administered 2023-10-31: 75 mL via INTRAVENOUS

## 2023-10-31 MED ORDER — THIAMINE MONONITRATE 100 MG PO TABS
100.0000 mg | ORAL_TABLET | Freq: Every day | ORAL | Status: DC
  Administered 2023-10-31 – 2023-11-06 (×7): 100 mg via ORAL
  Filled 2023-10-31 (×7): qty 1

## 2023-10-31 MED ORDER — LATANOPROST 0.005 % OP SOLN
1.0000 [drp] | Freq: Every day | OPHTHALMIC | Status: DC
  Administered 2023-11-01 – 2023-11-05 (×5): 1 [drp] via OPHTHALMIC
  Filled 2023-10-31 (×2): qty 2.5

## 2023-10-31 MED ORDER — FOLIC ACID 1 MG PO TABS
1.0000 mg | ORAL_TABLET | Freq: Every day | ORAL | Status: DC
  Administered 2023-10-31 – 2023-11-06 (×7): 1 mg via ORAL
  Filled 2023-10-31 (×7): qty 1

## 2023-10-31 MED ORDER — LACTATED RINGERS IV SOLN: INTRAVENOUS | Status: DC

## 2023-10-31 MED ORDER — POTASSIUM CHLORIDE 20 MEQ PO PACK
40.0000 meq | PACK | Freq: Once | ORAL | Status: AC
  Administered 2023-10-31: 40 meq via ORAL
  Filled 2023-10-31: qty 2

## 2023-10-31 MED ORDER — POLYETHYLENE GLYCOL 3350 17 G PO PACK: 17.0000 g | PACK | Freq: Every day | ORAL | Status: DC | PRN

## 2023-10-31 MED ORDER — MELATONIN 5 MG PO TABS
10.0000 mg | ORAL_TABLET | Freq: Every day | ORAL | Status: DC
  Administered 2023-10-31 – 2023-11-03 (×4): 10 mg via ORAL
  Filled 2023-10-31 (×4): qty 2

## 2023-10-31 MED ORDER — ACETAMINOPHEN 325 MG PO TABS
650.0000 mg | ORAL_TABLET | Freq: Four times a day (QID) | ORAL | Status: DC | PRN
  Administered 2023-11-03: 650 mg via ORAL
  Filled 2023-10-31: qty 2

## 2023-10-31 MED ORDER — ACETAMINOPHEN 650 MG RE SUPP: 650.0000 mg | Freq: Four times a day (QID) | RECTAL | Status: DC | PRN

## 2023-10-31 MED ORDER — SODIUM CHLORIDE 0.9 % IV SOLN
2.0000 g | INTRAVENOUS | Status: AC
  Administered 2023-10-31 – 2023-11-04 (×5): 2 g via INTRAVENOUS
  Filled 2023-10-31 (×5): qty 20

## 2023-10-31 MED ORDER — SODIUM CHLORIDE 0.9 % IV BOLUS
1000.0000 mL | Freq: Once | INTRAVENOUS | Status: AC
  Administered 2023-10-31: 1000 mL via INTRAVENOUS

## 2023-10-31 MED ORDER — LISINOPRIL 5 MG PO TABS
5.0000 mg | ORAL_TABLET | Freq: Every day | ORAL | Status: DC
  Administered 2023-11-01 – 2023-11-06 (×6): 5 mg via ORAL
  Filled 2023-10-31 (×6): qty 1

## 2023-10-31 MED ORDER — DILTIAZEM HCL ER 240 MG PO CP24: 240.0000 mg | ORAL_CAPSULE | Freq: Every day | ORAL | Status: DC

## 2023-10-31 MED ORDER — SODIUM CHLORIDE 0.9 % IV SOLN
500.0000 mg | INTRAVENOUS | Status: AC
  Administered 2023-10-31 – 2023-11-02 (×3): 500 mg via INTRAVENOUS
  Filled 2023-10-31 (×3): qty 5

## 2023-10-31 MED ORDER — CITALOPRAM HYDROBROMIDE 20 MG PO TABS
20.0000 mg | ORAL_TABLET | Freq: Every day | ORAL | Status: DC
  Administered 2023-11-01: 20 mg via ORAL
  Filled 2023-10-31: qty 1

## 2023-10-31 MED ORDER — TIMOLOL MALEATE 0.5 % OP SOLN
1.0000 [drp] | Freq: Two times a day (BID) | OPHTHALMIC | Status: DC
  Administered 2023-11-01 – 2023-11-06 (×11): 1 [drp] via OPHTHALMIC
  Filled 2023-10-31 (×2): qty 5

## 2023-10-31 MED ORDER — ONDANSETRON HCL 4 MG/2ML IJ SOLN: 4.0000 mg | Freq: Four times a day (QID) | INTRAMUSCULAR | Status: DC | PRN

## 2023-10-31 MED ORDER — ADULT MULTIVITAMIN W/MINERALS CH
1.0000 | ORAL_TABLET | Freq: Every day | ORAL | Status: DC
  Administered 2023-10-31 – 2023-11-06 (×7): 1 via ORAL
  Filled 2023-10-31 (×7): qty 1

## 2023-10-31 NOTE — Assessment & Plan Note (Signed)
 Small volume subarachnoid hemorrhage after ground-level fall earlier today.  Patient is not on any blood thinners.  Neurosurgery has evaluated imaging and recommended CTA, which does not show any vascular anomalies.  Due to this, no further neurosurgery recommendations at this time.

## 2023-10-31 NOTE — Assessment & Plan Note (Addendum)
-   Resume home diltiazem and Lisinopril tomorrow - Hold home HCTZ in light of hyponatremia

## 2023-10-31 NOTE — ED Provider Notes (Signed)
 [facility] Provider Note   Event Date/Time   First MD Initiated Contact with Patient 10/31/23 1044     (approximate) History  Fall (/)  HPI Cassie Taylor is a 83 y.o. female with a past medical history of hypertension and dementia who presents via EMS from long-term care facility after an unwitnessed fall today.  Patient denies any recollection of this fall and states she is only having pain over the left elbow.  EMS noted that patient was having "dizziness" over the last 2 days and has been receiving prednisone ROS: Patient currently denies any vision changes, tinnitus, difficulty speaking, facial droop, sore throat, chest pain, shortness of breath, abdominal pain, nausea/vomiting/diarrhea, dysuria, or weakness/numbness/paresthesias in any extremity   Physical Exam  Triage Vital Signs: ED Triage Vitals  Encounter Vitals Group     BP --      Systolic BP Percentile --      Diastolic BP Percentile --      Pulse --      Resp --      Temp 10/31/23 1047 97.9 F (36.6 C)     Temp src --      SpO2 --      Weight 10/31/23 1050 100 lb 15.5 oz (45.8 kg)     Height 10/31/23 1050 5\' 2"  (1.575 m)     Head Circumference --      Peak Flow --      Pain Score --      Pain Loc --      Pain Education --      Exclude from Growth Chart --    Most recent vital signs: Vitals:   11/01/23 1616 11/01/23 2006  BP: 118/61 116/60  Pulse: 78 75  Resp: 19 17  Temp: 97.8 F (36.6 C) 98.4 F (36.9 C)  SpO2: 97% 96%   General: Awake, oriented x4. CV:  Good peripheral perfusion.  Resp:  Normal effort.  Abd:  No distention.  Other:  Elderly well-developed, well-nourished Caucasian female resting comfortably in no acute distress.  Small superficial abrasion to the lateral left elbow ED Results / Procedures / Treatments  Labs (all labs ordered are listed, but only abnormal results are displayed) Labs Reviewed  COMPREHENSIVE METABOLIC PANEL WITH GFR - Abnormal; Notable  for the following components:      Result Value   Sodium 117 (*)    Potassium 2.7 (*)    Chloride 75 (*)    CO2 33 (*)    Glucose, Bld 102 (*)    BUN 7 (*)    Calcium 8.1 (*)    Total Protein 6.4 (*)    Albumin 2.6 (*)    AST 49 (*)    ALT 65 (*)    All other components within normal limits  CBC WITH DIFFERENTIAL/PLATELET - Abnormal; Notable for the following components:   WBC 15.9 (*)    RBC 3.84 (*)    HCT 33.6 (*)    Platelets 442 (*)    Neutro Abs 13.1 (*)    Monocytes Absolute 1.2 (*)    Abs Immature Granulocytes 0.17 (*)    All other components within normal limits  URINALYSIS, W/ REFLEX TO CULTURE (INFECTION SUSPECTED) - Abnormal; Notable for the following components:   Color, Urine STRAW (*)    APPearance CLEAR (*)    All other components within normal limits  BASIC METABOLIC PANEL WITH GFR - Abnormal; Notable for the following components:   Sodium 122 (*)  Potassium 3.2 (*)    Chloride 88 (*)    Glucose, Bld 115 (*)    Creatinine, Ser 0.42 (*)    Calcium 6.9 (*)    All other components within normal limits  BASIC METABOLIC PANEL WITH GFR - Abnormal; Notable for the following components:   Sodium 120 (*)    Chloride 82 (*)    BUN 7 (*)    Creatinine, Ser 0.40 (*)    Calcium 7.8 (*)    All other components within normal limits  CBC - Abnormal; Notable for the following components:   WBC 15.6 (*)    RBC 3.63 (*)    Hemoglobin 11.6 (*)    HCT 32.1 (*)    MCHC 36.1 (*)    Platelets 441 (*)    All other components within normal limits  BASIC METABOLIC PANEL WITH GFR - Abnormal; Notable for the following components:   Sodium 123 (*)    Potassium 3.3 (*)    Chloride 84 (*)    Glucose, Bld 101 (*)    Calcium 7.6 (*)    All other components within normal limits  OSMOLALITY - Abnormal; Notable for the following components:   Osmolality 247 (*)    All other components within normal limits  VITAMIN B12 - Abnormal; Notable for the following components:    Vitamin B-12 1,885 (*)    All other components within normal limits  HEPATIC FUNCTION PANEL - Abnormal; Notable for the following components:   Total Protein 5.4 (*)    Albumin 2.1 (*)    ALT 54 (*)    All other components within normal limits  IRON AND TIBC - Abnormal; Notable for the following components:   TIBC 185 (*)    All other components within normal limits  PHOSPHORUS - Abnormal; Notable for the following components:   Phosphorus 2.3 (*)    All other components within normal limits  RESP PANEL BY RT-PCR (RSV, FLU A&B, COVID)  RVPGX2  PROCALCITONIN  MAGNESIUM  FOLATE  PROCALCITONIN  MAGNESIUM  TSH  BASIC METABOLIC PANEL WITH GFR  CBC  MAGNESIUM  PHOSPHORUS  PROTEIN ELECTROPHORESIS, SERUM  KAPPA/LAMBDA LIGHT CHAINS  TROPONIN I (HIGH SENSITIVITY)  TROPONIN I (HIGH SENSITIVITY)   EKG ED ECG REPORT I, Merwyn Katos, the attending physician, personally viewed and interpreted this ECG. Date: 10/31/2023 EKG Time: 1053 Rate: 73 Rhythm: normal sinus rhythm QRS Axis: normal Intervals: normal ST/T Wave abnormalities: normal Narrative Interpretation: no evidence of acute ischemia RADIOLOGY ED MD interpretation: CT of the head shows acute traumatic subarachnoid hemorrhage CT of the cervical spine interpreted by me does not show any evidence of acute abnormalities including no acute fracture, malalignment, height loss, or dislocation One-view portable chest x-ray interpreted by me shows no evidence of acute abnormalities including no pneumonia, pneumothorax, or widened mediastinum -Agree with radiology assessment Official radiology report(s): No results found. PROCEDURES: Critical Care performed: No Procedures MEDICATIONS ORDERED IN ED: Medications  azithromycin (ZITHROMAX) 500 mg in sodium chloride 0.9 % 250 mL IVPB (0 mg Intravenous Stopped 11/01/23 1625)  cefTRIAXone (ROCEPHIN) 2 g in sodium chloride 0.9 % 100 mL IVPB (0 g Intravenous Stopped 11/01/23 1428)  sodium  chloride flush (NS) 0.9 % injection 3 mL (3 mLs Intravenous Given 11/01/23 2217)  acetaminophen (TYLENOL) tablet 650 mg (has no administration in time range)    Or  acetaminophen (TYLENOL) suppository 650 mg (has no administration in time range)  ondansetron (ZOFRAN) tablet 4 mg (has no administration  in time range)    Or  ondansetron (ZOFRAN) injection 4 mg (has no administration in time range)  polyethylene glycol (MIRALAX / GLYCOLAX) packet 17 g (has no administration in time range)  multivitamin with minerals tablet 1 tablet (1 tablet Oral Given 11/01/23 0938)  folic acid (FOLVITE) tablet 1 mg (1 mg Oral Given 11/01/23 0937)  thiamine (VITAMIN B1) tablet 100 mg (100 mg Oral Given 11/01/23 0938)  atorvastatin (LIPITOR) tablet 10 mg (10 mg Oral Given 11/01/23 2217)  latanoprost (XALATAN) 0.005 % ophthalmic solution 1 drop (1 drop Both Eyes Given 11/01/23 2214)  lisinopril (ZESTRIL) tablet 5 mg (5 mg Oral Given 11/01/23 0937)  timolol (TIMOPTIC) 0.5 % ophthalmic solution 1 drop (1 drop Both Eyes Given 11/01/23 2213)  melatonin tablet 10 mg (10 mg Oral Given 11/01/23 2213)  diltiazem (CARDIZEM CD) 24 hr capsule 240 mg (240 mg Oral Given 11/01/23 1023)  haloperidol lactate (HALDOL) injection 1 mg (has no administration in time range)  sodium chloride 0.9 % bolus 1,000 mL (0 mLs Intravenous Stopped 10/31/23 1845)  potassium chloride 10 mEq in 100 mL IVPB (0 mEq Intravenous Stopped 10/31/23 1844)  iohexol (OMNIPAQUE) 350 MG/ML injection 75 mL (75 mLs Intravenous Contrast Given 10/31/23 1237)  potassium chloride (KLOR-CON) packet 40 mEq (40 mEq Oral Given 10/31/23 1407)  phosphorus (K PHOS NEUTRAL) tablet 500 mg (500 mg Oral Given 11/01/23 2213)   IMPRESSION / MDM / ASSESSMENT AND PLAN / ED COURSE  I reviewed the triage vital signs and the nursing notes.                             The patient is on the cardiac monitor to evaluate for evidence of arrhythmia and/or significant heart rate changes. Patient's  presentation is most consistent with acute presentation with potential threat to life or bodily function. Patient found to be hyponatremic to 117. Patient mentating normally. Patient not hypovolemic so doubt extra renal losses such as GI losses, burns, 3rd spacing, or diuretic use. Labs are not consistent with adrenal insufficiency. 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 Patient not hypervolemic on exam with no history of CHF, cirrhosis, nephrotic syndrome, no acute renal failure. Tx: 1L NS  Dispo: Admit to medicine   FINAL CLINICAL IMPRESSION(S) / ED DIAGNOSES   Final diagnoses:  Hyponatremia   Rx / DC Orders   ED Discharge Orders     None      Note:  This document was prepared using Dragon voice recognition software and may include unintentional dictation errors.   Merwyn Katos, MD 11/01/23 [phone]

## 2023-10-31 NOTE — Assessment & Plan Note (Signed)
 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  She did not tolerate Aricept.  Per her daughter in law at bedside, she is currently at her baseline.  - Delirium precautions

## 2023-10-31 NOTE — Progress Notes (Signed)
 Spoke with ED about patient with new fall and CT head showing small amounts of SAH. The areas do appear to be traumatic in nature but given distribution, would recommend a CTA of the head. If no sign of vascular abnormality, then no further imaging or follow up needed for neurosurgery

## 2023-10-31 NOTE — Assessment & Plan Note (Signed)
 Likely multifactorial, reactive in the setting of SAH and recent initiation of prednisone 2 days prior (apparently facility started prednisone for dizziness).  CAP also contributing.  - Recheck CBC in the a.m.

## 2023-10-31 NOTE — ED Triage Notes (Signed)
 Patient to ED via [facility] from [facility] for a fall. Pt states she remembers the fall but doesn't remember after the fall. Denies blood thinners. Scrape to left elbow. States head "feels funny." States dizziness x2 days- taking prednisone for same. Hx of dementia- tested positive for RSV 10 days ago per son  EMS VS: 143/70 92% RA 97.4 133 cbg 76 HR

## 2023-10-31 NOTE — Assessment & Plan Note (Addendum)
 Patient is presenting with acute on chronic hyponatremia with sodium of 117.  Likely that dizziness may be a symptom of her hyponatremia, however no lethargy or altered mental status on examination.  Likely triggered by recent viral infection, with poor p.o. intake.  - S/p 1 L bolus of normal saline in the ED - Hold further fluids until repeat BMP.  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

## 2023-10-31 NOTE — Assessment & Plan Note (Signed)
 Due to poor p.o. intake.  - Replacement as ordered

## 2023-10-31 NOTE — H&P (Addendum)
 History and Physical    PatientKAMEISHA Taylor Taylor:[id number] DOB: 12/16/1940 DOA: 10/31/2023 DOS: the patient was seen and examined on 10/31/2023 PCP: Nonda Lou, MD  Patient coming from: ALF/ILF  Chief Complaint:  Chief Complaint  Patient presents with   Fall        HPI: Cassie Taylor is a 83 y.o. female with medical history significant of dementia, hypertension, hyperlipidemia, chronic hyponatremia, who presents to the ED due to ground-level fall.  History obtained through chart review and from patient's daughter-in-law at bedside, given patient's underlying dementia.  Per chart review, Patient had a ground-level fall earlier today that was unwitnessed.  Facility staff were not sure if she hit her head, however she complained of her head hurting.  The facility noted she had been also complaining of dizziness for the last few days.  This is after recent diagnosis of RSV and prolonged poor appetite afterwards.  RSV diagnosis was approximately 10 days ago.  At this time, [name] denies any pain including chest pain, abdominal pain.  She denies any shortness of breath, cough, nausea, vomiting, diarrhea.  She denies any dizziness, headache or focal weakness.  ED course: On arrival to the ED, patient was normotensive at 134/67 with heart rate of 72.  She was saturating at 93% on room air.  She was afebrile at 97.9.  Initial workup notable for WBC of 15.9, platelets 442, sodium 117, potassium 2.7, BUN 7, creatinine 0.51, bicarb 33, AST 49, ALT 65, GFR above 60.  Troponin negative.  COVID-19, influenza and RSV PCR negative.  Urinalysis negative.  CT of the head notable for small volume acute subarachnoid hemorrhage with small hemorrhagic contusion in the anterior left temporal lobe.  Neurosurgery consulted, recommended CTA, and if negative, no further recommendations.  CTA ordered and pending.  [facility] contacted for admission.  Review of Systems: As mentioned in the history of  present illness. All other systems reviewed and are negative.  Past Medical History:  Diagnosis Date   Benign head tremor    Hyperlipidemia    Hypertension    Motion sickness    cars   Scoliosis    lower back   Past Surgical History:  Procedure Laterality Date   CATARACT EXTRACTION W/ INTRAOCULAR LENS IMPLANT Left 07/20/2012   [facility] - Dr Inez Pilgrim   CATARACT EXTRACTION W/PHACO Right 01/31/2020   Procedure: CATARACT EXTRACTION PHACO AND INTRAOCULAR LENS PLACEMENT (IOC) RIGHT 5.76 00:50.2 11.5%;  Surgeon: Lockie Mola, MD;  Location[facility];  Service: Ophthalmology;  Laterality: Right;   COLONOSCOPY     FOOT SURGERY     Social History:  reports that she has never smoked. She has never used smokeless tobacco. She reports current alcohol use of about 7.0 standard drinks of alcohol per week. She reports that she does not currently use drugs.  Allergies  Allergen Reactions   Codeine Nausea And Vomiting    Dizziness    History reviewed. No pertinent family history.  Prior to Admission medications   Medication Sig Start Date End Date Taking? Authorizing Provider  Aspirin 81 MG CAPS Take 325 mg by mouth daily.    [provider]  atorvastatin (LIPITOR) 10 MG tablet Take 10 mg by mouth daily.    [provider]  CALCIUM CITRATE PO Take 600 mg by mouth in the morning and at bedtime.    [provider]  cephALEXin (KEFLEX) 500 MG capsule Take 1 capsule (500 mg total) by mouth 3 (three)  times daily. 02/21/21   Delton See, MD  diltiazem (DILACOR XR) 240 MG 24 hr capsule Take 240 mg by mouth daily.    [provider]  Ferrous Sulfate (IRON) 325 (65 Fe) MG TABS Take by mouth daily.    [provider]  meclizine (ANTIVERT) 25 MG tablet Take 1 tablet (25 mg total) by mouth 3 (three) times daily as needed for dizziness. 01/30/21   Delton See, MD  Multiple Vitamin (MULTIVITAMIN) tablet Take 1 tablet by mouth daily.     [provider]  Omega-3 1000 MG CAPS Take by mouth daily.    [provider]    Physical Exam: Vitals:   10/31/23 1047 10/31/23 1050 10/31/23 1054  BP:   134/67  Pulse:   73  Resp:   17  Temp: 97.9 F (36.6 C)    SpO2:   93%  Weight:  45.8 kg   Height:  5\' 2"  (1.575 m)    Physical Exam Vitals and nursing note reviewed.  Constitutional:      Appearance: She is underweight. She is not ill-appearing.  HENT:     Head: Normocephalic and atraumatic.     Comments: No contusions or abrasions noted    Mouth/Throat:     Mouth: Mucous membranes are moist.     Pharynx: Oropharynx is clear.  Eyes:     Pupils: Pupils are equal, round, and reactive to light.  Cardiovascular:     Rate and Rhythm: Normal rate and regular rhythm.     Heart sounds: No murmur heard. Pulmonary:     Effort: Pulmonary effort is normal. No respiratory distress.     Breath sounds: Normal breath sounds. No wheezing, rhonchi or rales.  Abdominal:     General: Bowel sounds are normal. There is no distension.     Palpations: Abdomen is soft.     Tenderness: There is no abdominal tenderness. There is no guarding.  Musculoskeletal:     Right lower leg: No edema.     Left lower leg: No edema.  Skin:    General: Skin is warm and dry.  Neurological:     Mental Status: She is alert.     Comments:  Patient is alert and oriented to person, family members at bedside but not quite to situation. No focal weakness noted No tremor  Psychiatric:        Mood and Affect: Mood normal.        Behavior: Behavior normal.    Data Reviewed: CBC with WBC of 15.9, hemoglobin of 12.0, platelets of 442 CMP with sodium of 117, potassium 2.7, chloride 75, bicarb 33, glucose 102, BUN 7, creatinine 0.71, albumin 2.6, AST 49, ALT 65, GFR above 60 Troponin 7 COVID #19, influenza and RSV PCR negative Urinalysis with no abnormalities  EKG personally reviewed.  Sinus rhythm with rate of 73.  No acute ischemic  changes.  DG Chest 1 View Result Date: 10/31/2023 CLINICAL DATA:  [id number] CAP (community acquired pneumonia) [id number] EXAM: CHEST  1 VIEW COMPARISON:  None Available. FINDINGS: The heart size and mediastinal contours are within normal limits. Aortic atherosclerosis. Patchy bibasilar airspace opacities, right worse than left. Probable trace bilateral pleural effusions. No pneumothorax. The visualized skeletal structures are unremarkable. IMPRESSION: Patchy bibasilar airspace opacities, right worse than left, suspicious for multifocal pneumonia. Electronically Signed   By: Duanne Guess D.O.   On: 10/31/2023 14:13   CT ANGIO HEAD NECK W WO CM Result Date: 10/31/2023 CLINICAL DATA:  Neuro deficit, acute, stroke suspected. Intracranial hemorrhage. EXAM: CT ANGIOGRAPHY HEAD AND NECK WITH AND WITHOUT CONTRAST TECHNIQUE: Multidetector CT imaging of the head and neck was performed using the standard protocol during bolus administration of intravenous contrast. Multiplanar CT image reconstructions and MIPs were obtained to evaluate the vascular anatomy. Carotid stenosis measurements (when applicable) are obtained utilizing NASCET criteria, using the distal internal carotid diameter as the denominator. RADIATION DOSE REDUCTION: This exam was performed according to the departmental dose-optimization program which includes automated exposure control, adjustment of the mA and/or kV according to patient size and/or use of iterative reconstruction technique. CONTRAST:  75mL OMNIPAQUE IOHEXOL 350 MG/ML SOLN COMPARISON:  None Available. FINDINGS: CTA NECK FINDINGS Aortic arch: Standard branching with mild atherosclerosis. No significant stenosis of the arch vessel origins. Right carotid system: Patent with a small amount of calcified plaque at the carotid bifurcation. No evidence of a significant stenosis or dissection. Left carotid system: Patent with a small amount of calcified plaque at the carotid bifurcation. No evidence  of a significant stenosis or dissection. Vertebral arteries: Patent and codominant without evidence of stenosis or dissection. Skeleton: No acute osseous abnormality or suspicious lesion. Cervical spine evaluated in detail on today's earlier dedicated spine CT. Other neck: No evidence of cervical lymphadenopathy or mass. Upper chest: Partially visualized small bilateral pleural effusions, right larger than left. Bronchial wall thickening. Partially visualized patchy consolidation and small nodular ground-glass opacities in the right greater than left upper lobes. Mild mediastinal lymphadenopathy including a 1.1 cm short axis precarinal node and 1.5 cm subcarinal node. Review of the MIP images confirms the above findings CTA HEAD FINDINGS Anterior circulation: The internal carotid arteries are patent from skull base to carotid termini with minimal atherosclerosis not resulting in a significant stenosis. ACAs and MCAs are patent without evidence of a proximal or intra clues or significant proximal stenosis. No aneurysm or vascular malformation is identified. Posterior circulation: The intracranial vertebral arteries are widely patent to the basilar. Patent PICA, AICA, and SCA origins are visualized bilaterally. The basilar artery is widely patent. There is a large right posterior communicating artery with hypoplasia of the right P1 segment. Both PCAs are patent without evidence a significant proximal stenosis. No aneurysm or vascular malformation is identified. Venous sinuses: As permitted by contrast timing, patent. Anatomic variants: Predominantly fetal type origin of the right PCA. Dominant left A2 segment. Duplicated left MCA. Review of the MIP images confirms the above findings IMPRESSION: 1. Mild atherosclerosis without a large vessel occlusion, significant stenosis, aneurysm, or vascular malformation in the head or neck. 2. Partially visualized small bilateral pleural effusions and patchy lung consolidation,  possibly reflecting pneumonia. Recommend correlation with chest radiography. 3. Mild mediastinal lymphadenopathy, nonspecific but possibly reactive. Electronically Signed   By: Sebastian Ache M.D.   On: 10/31/2023 12:55   CT Head Wo Contrast Result Date: 10/31/2023 CLINICAL DATA:  Head trauma, minor (Age >= 65y). Fall. Dizziness. History of dementia. EXAM: CT HEAD WITHOUT CONTRAST TECHNIQUE: Contiguous axial images were obtained from the base of the skull through the vertex without intravenous contrast. RADIATION DOSE REDUCTION: This exam was performed according to the departmental dose-optimization program which includes automated exposure control, adjustment of the mA and/or kV according to patient size and/or use of iterative reconstruction technique. COMPARISON:  None Available. FINDINGS: Brain: There is scattered small volume acute subarachnoid hemorrhage within cerebral sulci bilaterally, including over the frontal convexities near the vertex. Subarachnoid hemorrhage is also noted in the left sylvian fissure and  quadrigeminal cistern. There is a 5 mm focus of intra-axial hemorrhage in the anterior left temporal lobe. No acute infarct, mass, midline shift, or extra-axial fluid collection is identified. Mild cerebral atrophy is within normal limits for age. Cerebral white matter hypodensities are nonspecific but compatible with mild chronic small vessel ischemic disease. Vascular: Calcified atherosclerosis at the skull base. No hyperdense vessel. Skull: No acute fracture or suspicious lesion. Sinuses/Orbits: Paranasal sinuses and mastoid air cells are clear. Bilateral cataract extraction. Other: None. Critical Value/emergent results were called by telephone at the time of interpretation on 10/31/2023 at 11:28 am to Dr. Roxan Hockey, who verbally acknowledged these results. IMPRESSION: 1. Small volume acute subarachnoid hemorrhage. 2. Small hemorrhagic contusion in the anterior left temporal lobe. 3. Mild chronic  small vessel ischemic disease. Electronically Signed   By: Sebastian Ache M.D.   On: 10/31/2023 11:28   CT Cervical Spine Wo Contrast Result Date: 10/31/2023 CLINICAL DATA:  Neck trauma.  Fall.  Limited memory of injury. EXAM: CT CERVICAL SPINE WITHOUT CONTRAST TECHNIQUE: Multidetector CT imaging of the cervical spine was performed without intravenous contrast. Multiplanar CT image reconstructions were also generated. RADIATION DOSE REDUCTION: This exam was performed according to the departmental dose-optimization program which includes automated exposure control, adjustment of the mA and/or kV according to patient size and/or use of iterative reconstruction technique. COMPARISON:  None Available. FINDINGS: Alignment: Physiologic. Skull base and vertebrae: No evidence of acute fracture or traumatic subluxation. Soft tissues and spinal canal: No prevertebral fluid or swelling. No visible canal hematoma. Disc levels: Multilevel spondylosis with disc space narrowing, endplate osteophytes and facet hypertrophy. No evidence of large disc herniation or high-grade foraminal narrowing. Upper chest: No acute findings. Other: Bilateral carotid atherosclerosis. Bilateral TMJ degenerative changes. IMPRESSION: 1. No evidence of acute cervical spine fracture, traumatic subluxation or static signs of instability. 2. Multilevel cervical spondylosis. Electronically Signed   By: Carey Bullocks M.D.   On: 10/31/2023 11:21   Results are pending, will review when available.  Assessment and Plan:  * Hyponatremia Patient is presenting with acute on chronic hyponatremia with sodium of 117.  Likely that dizziness may be a symptom of her hyponatremia, however no lethargy or altered mental status on examination.  Likely triggered by recent viral infection, with poor p.o. intake.  - S/p 1 L bolus of normal saline in the ED - Hold further fluids until repeat BMP.  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  Patient is not on any blood thinners.  Neurosurgery has evaluated imaging and recommended CTA, which does not show any vascular anomalies.  Due to this, no further neurosurgery recommendations at this time.  CAP (community acquired pneumonia) Per chart review and family at bedside, patient was recently diagnosed with RSV.  CT imaging today is concerning for signs of pneumonia, which I suspect are residual from her recent RSV, however given her oxygen levels were on the lower end of normal and she is actively coughing during examination, will cover for bacterial etiology as well.  - Chest x-ray - Start azithromycin and ceftriaxone - Procalcitonin - Supplemental oxygen as needed to maintain oxygen saturation above 88%  Dementia without behavioral disturbance (HCC) History of advancing dementia, with neurology notes indicating possible frontotemporal versus Alzheimer's.  She did not tolerate Aricept.  Per  her daughter in law at bedside, she is currently at her baseline.  - Delirium precautions  Hypertension - Resume home diltiazem and Lisinopril tomorrow - Hold home HCTZ in light of hyponatremia  Leukocytosis Likely multifactorial, reactive in the setting of SAH and recent initiation of prednisone 2 days prior (apparently facility started prednisone for dizziness).  CAP also contributing.  - Recheck CBC in the a.m.  Hypokalemia Due to poor p.o. intake.  - Replacement as ordered  Advance Care Planning:   Code Status: Full Code verified by patient.  Her daughter-in-law states that they would like to respect her wishes.  Consults: Neurosurgery (signed off)  Family Communication: Patient's daughter-in-law updated at bedside  Severity of Illness: The appropriate  patient status for this patient is INPATIENT. Inpatient status is judged to be reasonable and necessary in order to provide the required intensity of service to ensure the patient's safety. The patient's presenting symptoms, physical exam findings, and initial radiographic and laboratory data in the context of their chronic comorbidities is felt to place them at high risk for further clinical deterioration. Furthermore, it is not anticipated that the patient will be medically stable for discharge from the hospital within 2 midnights of admission.   * I certify that at the point of admission it is my clinical judgment that the patient will require inpatient hospital care spanning beyond 2 midnights from the point of admission due to high intensity of service, high risk for further deterioration and high frequency of surveillance required.*  Author: Verdene Lennert, MD 10/31/2023 3:25 PM  For on call review [email or web address].

## 2023-10-31 NOTE — Assessment & Plan Note (Signed)
 Per chart review and family at bedside, patient was recently diagnosed with RSV.  CT imaging today is concerning for signs of pneumonia, which I suspect are residual from her recent RSV, however given her oxygen levels were on the lower end of normal and she is actively coughing during examination, will cover for bacterial etiology as well.  - Chest x-ray - Start azithromycin and ceftriaxone - Procalcitonin - Supplemental oxygen as needed to maintain oxygen saturation above 88%

## 2023-11-01 DIAGNOSIS — E871 Hypo-osmolality and hyponatremia: Secondary | ICD-10-CM | POA: Diagnosis not present

## 2023-11-01 LAB — BASIC METABOLIC PANEL WITH GFR
Anion gap: 9 (ref 5–15)
Anion gap: 8 (ref 5–15)
BUN: 7 mg/dL — ABNORMAL LOW (ref 8–23)
BUN: 9 mg/dL (ref 8–23)
CO2: 29 mmol/L (ref 22–32)
CO2: 31 mmol/L (ref 22–32)
Calcium: 7.8 mg/dL — ABNORMAL LOW (ref 8.9–10.3)
Calcium: 7.6 mg/dL — ABNORMAL LOW (ref 8.9–10.3)
Chloride: 82 mmol/L — ABNORMAL LOW (ref 98–111)
Chloride: 84 mmol/L — ABNORMAL LOW (ref 98–111)
Creatinine, Ser: 0.4 mg/dL — ABNORMAL LOW (ref 0.44–1.00)
Creatinine, Ser: 0.44 mg/dL (ref 0.44–1.00)
GFR, Estimated: >60 mL/min (ref >60)
GFR, Estimated: >60 mL/min (ref >60)
Glucose, Bld: 88 mg/dL (ref 70–99)
Glucose, Bld: 101 mg/dL — ABNORMAL HIGH (ref 70–99)
Potassium: 3.5 mmol/L (ref 3.5–5.1)
Potassium: 3.3 mmol/L — ABNORMAL LOW (ref 3.5–5.1)
Sodium: 120 mmol/L — ABNORMAL LOW (ref 135–145)
Sodium: 123 mmol/L — ABNORMAL LOW (ref 135–145)

## 2023-11-01 LAB — PROCALCITONIN: Procalcitonin: <0.1 ng/mL

## 2023-11-01 LAB — HEPATIC FUNCTION PANEL
ALT: 54 U/L — ABNORMAL HIGH (ref 0–44)
AST: 40 U/L (ref 15–41)
Albumin: 2.1 g/dL — ABNORMAL LOW (ref 3.5–5.0)
Alkaline Phosphatase: 61 U/L (ref 38–126)
Bilirubin, Direct: 0.2 mg/dL (ref 0.0–0.2)
Indirect Bilirubin: 0.4 mg/dL (ref 0.3–0.9)
Total Bilirubin: 0.6 mg/dL (ref 0.0–1.2)
Total Protein: 5.4 g/dL — ABNORMAL LOW (ref 6.5–8.1)

## 2023-11-01 LAB — CBC
HCT: 32.1 % — ABNORMAL LOW (ref 36.0–46.0)
Hemoglobin: 11.6 g/dL — ABNORMAL LOW (ref 12.0–15.0)
MCH: 32 pg (ref 26.0–34.0)
MCHC: 36.1 g/dL — ABNORMAL HIGH (ref 30.0–36.0)
MCV: 88.4 fL (ref 80.0–100.0)
Platelets: 441 10*3/uL — ABNORMAL HIGH (ref 150–400)
RBC: 3.63 MIL/uL — ABNORMAL LOW (ref 3.87–5.11)
RDW: 12.1 % (ref 11.5–15.5)
WBC: 15.6 10*3/uL — ABNORMAL HIGH (ref 4.0–10.5)
nRBC: 0 % (ref 0.0–0.2)

## 2023-11-01 LAB — PHOSPHORUS: Phosphorus: 2.3 mg/dL — ABNORMAL LOW (ref 2.5–4.6)

## 2023-11-01 LAB — IRON AND TIBC
Iron: 30 ug/dL (ref 28–170)
Saturation Ratios: 16 % (ref 10.4–31.8)
TIBC: 185 ug/dL — ABNORMAL LOW (ref 250–450)
UIBC: 155 ug/dL

## 2023-11-01 LAB — MAGNESIUM: Magnesium: 1.8 mg/dL (ref 1.7–2.4)

## 2023-11-01 LAB — VITAMIN B12: Vitamin B-12: 1885 pg/mL — ABNORMAL HIGH (ref 180–914)

## 2023-11-01 LAB — TSH: TSH: 1.895 u[IU]/mL (ref 0.350–4.500)

## 2023-11-01 LAB — FOLATE: Folate: 16.2 ng/mL (ref >5.9)

## 2023-11-01 LAB — OSMOLALITY: Osmolality: 247 mosm/kg — CL (ref 275–295)

## 2023-11-01 MED ORDER — HALOPERIDOL LACTATE 5 MG/ML IJ SOLN
1.0000 mg | Freq: Four times a day (QID) | INTRAMUSCULAR | Status: AC | PRN
  Administered 2023-11-03 – 2023-11-04 (×2): 1 mg via INTRAVENOUS
  Filled 2023-11-01 (×3): qty 1

## 2023-11-01 MED ORDER — K PHOS MONO-SOD PHOS DI & MONO 155-852-130 MG PO TABS
500.0000 mg | ORAL_TABLET | Freq: Three times a day (TID) | ORAL | Status: AC
  Administered 2023-11-01 (×3): 500 mg via ORAL
  Filled 2023-11-01 (×5): qty 2

## 2023-11-01 NOTE — ED Notes (Signed)
Ice water given at patient's request.

## 2023-11-01 NOTE — Plan of Care (Signed)

## 2023-11-01 NOTE — Progress Notes (Signed)
 [facility] Progress Note  Patient: Cassie Taylor    WUJ:[id number]  DOA: 10/31/2023     Date of Service: the patient was seen and examined on 11/01/2023  Chief Complaint  Patient presents with   Fall        Brief hospital course:  ZELL DOUCETTE is a 83 y.o. female with medical history significant of dementia, hypertension, hyperlipidemia, chronic hyponatremia, who presents to the ED due to ground-level fall.   History obtained through chart review and from patient's daughter-in-law at bedside, given patient's underlying dementia.  Per chart review, Patient had a ground-level fall earlier today that was unwitnessed.  Facility staff were not sure if she hit her head, however she complained of her head hurting.  The facility noted she had been also complaining of dizziness for the last few days.  This is after recent diagnosis of RSV and prolonged poor appetite afterwards.  RSV diagnosis was approximately 10 days ago.   At this time, Mrs. Palladino denies any pain including chest pain, abdominal pain.  She denies any shortness of breath, cough, nausea, vomiting, diarrhea.  She denies any dizziness, headache or focal weakness.   ED course: On arrival to the ED, patient was normotensive at 134/67 with heart rate of 72.  She was saturating at 93% on room air.  She was afebrile at 97.9.  Initial workup notable for WBC of 15.9, platelets 442, sodium 117, potassium 2.7, BUN 7, creatinine 0.51, bicarb 33, AST 49, ALT 65, GFR above 60.  Troponin negative.  COVID-19, influenza and RSV PCR negative.  Urinalysis negative.  CT of the head notable for small volume acute subarachnoid hemorrhage with small hemorrhagic contusion in the anterior left temporal lobe.  Neurosurgery consulted, recommended CTA, and if negative, no further recommendations.  CTA ordered and pending.  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 Serum osmolality 247 low S/p NS bolus in the ED, patient was on LR which has been discontinued. Continue fluid restriction 1.5 L/day Na 117--120 Continue to trend sodium level Sodium correction range 8-10 mmol in 24 hours Nephrology consult appreciated  # Hypokalemia, potassium repleted. # Hypophosphatemia, Phos repleted. Monitor electrolytes and replete as needed.  # Community-acquired pneumonia CXR: Patchy bibasilar airspace opacities, right worse than left, suspicious for multifocal pneumonia. Continue ceftriaxone and azithromycin Trend WBC count   # Subarachnoid hemorrhage s/p ground-level fall CT head positive for subarachnoid hemorrhage CTA head negative for any large vessel occlusion, no aneurysms. Seen by neurosurgery, recommended no intervention if no abnormality on CT angiogram Patient may follow-up with neurosurgery as an outpatient   # Hypertension Continue Cardizem and lisinopril Held HCTZ due to hyponatremia Monitor BP and titrate medications accordingly   Body mass index is 18.47 kg/m.  Interventions:  Diet: Regular diet, fluid striction 1.5 L/day DVT Prophylaxis: Therapeutic Anticoagulation with subarachnoid hemorrhage    Advance goals of care discussion: Full code  Family Communication: family was present at bedside, at the time of interview.  The pt provided permission to discuss medical plan with the family. Opportunity was given to ask question and all questions were answered satisfactorily.   Disposition:  Pt is from ALF, admitted with fall, SAH and Hyponatremia, still has low Na, which precludes a safe discharge. Discharge to ALF vs SNF TBD , when stable, may need few days to improve.  Subjective: No significant events overnight, patient  denies any headache or dizziness, no chest pain, no shortness of breath.  Patient stated that she drinks a lot of water, has severe dementia, AO x 1.  Repeating same thing  again and again, difficult to educate.  Physical Exam: General: NAD, lying comfortably Appear in no distress, affect appropriate Eyes: PERRLA ENT: Oral Mucosa Clear, moist  Neck: no JVD,  Cardiovascular: S1 and S2 Present, no Murmur,  Respiratory: good respiratory effort, Bilateral Air entry equal and Decreased, no Crackles, no wheezes Abdomen: Bowel Sound present, Soft and no tenderness,  Skin: no rashes Extremities: no Pedal edema, no calf tenderness Neurologic: without any new focal findings Gait not checked due to patient safety concerns  Vitals:   11/01/23 0430 11/01/23 0500 11/01/23 0640 11/01/23 0930  BP: 115/62 122/74  (!) 140/74  Pulse: 76 83  91  Resp: 16 15  18   Temp:   97.8 F (36.6 C) 98 F (36.7 C)  TempSrc:   Oral Oral  SpO2: 100% 92%  98%  Weight:      Height:        Intake/Output Summary (Last 24 hours) at 11/01/2023 1306 Last data filed at 11/01/2023 0209 Gross per 24 hour  Intake 1550 ml  Output --  Net 1550 ml   Filed Weights   10/31/23 1050  Weight: 45.8 kg    Data Reviewed: I have personally reviewed and interpreted daily labs, tele strips, imagings as discussed above. I reviewed all nursing notes, pharmacy notes, vitals, pertinent old records I have discussed plan of care as described above with RN and patient/family.  CBC: Recent Labs  Lab 10/31/23 1048 11/01/23 0436  WBC 15.9* 15.6*  NEUTROABS 13.1*  --   HGB 12.0 11.6*  HCT 33.6* 32.1*  MCV 87.5 88.4  PLT 442* 441*   Basic Metabolic Panel: Recent Labs  Lab 10/31/23 1048 10/31/23 1850 11/01/23 0436  NA 117* 122* 120*  K 2.7* 3.2* 3.5  CL 75* 88* 82*  CO2 33* 25 29  GLUCOSE 102* 115* 88  BUN 7* 9 7*  CREATININE 0.51 0.42* 0.40*  CALCIUM 8.1* 6.9* 7.8*  MG 2.0  --  1.8  PHOS  --   --  2.3*    Studies: DG Chest 1 View Result Date: 10/31/2023 CLINICAL DATA:  [id number] CAP (community acquired pneumonia) [id number] EXAM: CHEST  1 VIEW COMPARISON:  None Available. FINDINGS: The  heart size and mediastinal contours are within normal limits. Aortic atherosclerosis. Patchy bibasilar airspace opacities, right worse than left. Probable trace bilateral pleural effusions. No pneumothorax. The visualized skeletal structures are unremarkable. IMPRESSION: Patchy bibasilar airspace opacities, right worse than left, suspicious for multifocal pneumonia. Electronically Signed   By: Duanne Guess D.O.   On: 10/31/2023 14:13    Scheduled Meds:  atorvastatin  10 mg Oral Daily   diltiazem  240 mg Oral Daily   folic acid  1 mg Oral Daily   latanoprost  1 drop Both Eyes QHS   lisinopril  5 mg Oral Daily   melatonin  10 mg Oral QHS   multivitamin with minerals  1 tablet Oral Daily   phosphorus  500 mg Oral TID   sodium chloride flush  3 mL Intravenous Q12H   thiamine  100 mg Oral Daily   timolol  1 drop Both Eyes BID   Continuous Infusions:  azithromycin Stopped (10/31/23 1845)   cefTRIAXone (ROCEPHIN)  IV Stopped (10/31/23 1845)   PRN Meds: acetaminophen **OR** acetaminophen, ondansetron **OR** ondansetron (ZOFRAN) IV,  polyethylene glycol  Time spent: 55 minutes  Author: Gillis Santa. MD [facility] 11/01/2023 1:06 PM  To reach On-call, see care teams to locate the attending and reach out to them via [email or web address]. If 7PM-7AM, please contact night-coverage If you still have difficulty reaching the attending provider, please page the [facility] (Director on Call) for [facility] on amion for assistance.

## 2023-11-01 NOTE — Consult Note (Signed)
 [facility] Consult Note: 11/01/23     Date of Admission:  10/31/2023           Reason for Consult:  Hyponatremia   Referring Provider: Gillis Santa, MD Primary Care Provider: Nonda Lou, MD   History of Presenting Illness:  Cassie Taylor is a 83 y.o. female with medical problems of dementia, hypertension, hyperlipidemia.  Information is obtained from the chart as well as from her daughter-in-law. They report that patient was sick with RSV couple weeks ago.  Last week she was lethargic.  On Saturday they saw that her legs were swollen.  She also fell at her assisted living.  She has been walking with a shuffling gait.  She has been drinking a lot of fluids since her respiratory illness.  Due to change in health status they brought her to the emergency room for evaluation.  In the ER she was found to have low sodium of 117.  Previous sodium of 131 in December 2024. This morning she is alert and able to finish all her breakfast. She is still drinking quite a bit of clear fluids. No leg edema.  Chest x-ray from 10/31/2023 shows patchy bibasilar airspace opacities right worse than left suspicion for multifocal pneumonia.  Patient is currently getting broad-spectrum antibiotics-Rocephin and Zithromax.   Review of Systems: ROS-see HPI.  ROS not reliable due to patient having dementia.  Past Medical History:  Diagnosis Date   Benign head tremor    Hyperlipidemia    Hypertension    Motion sickness    cars   Scoliosis    lower back    Social History   Tobacco Use   Smoking status: Never   Smokeless tobacco: Never  Vaping Use   Vaping status: Never Used  Substance Use Topics   Alcohol use: Yes    Alcohol/week: 7.0 standard drinks of alcohol    Types: 7 Cans of beer per week   Drug use: Not Currently    History reviewed. No pertinent family history.   OBJECTIVE: Blood pressure (!) 140/74, pulse 91, temperature 98 F (36.7 C),  temperature source Oral, resp. rate 18, height 5\' 2"  (1.575 m), weight 45.8 kg, SpO2 98%.  Physical Exam General Appearance-frail, elderly woman, laying in the bed HEENT-moist oral mucous membranes Pulmonary-coarse breath sounds at bases, Lucas O2 Cardiac-irregular rhythm Abdomen-soft, nontender, nondistended Extremities-no peripheral edema Neuro-able to answer simple questions appropriately  Lab Results Lab Results  Component Value Date   WBC 15.6 (H) 11/01/2023   HGB 11.6 (L) 11/01/2023   HCT 32.1 (L) 11/01/2023   MCV 88.4 11/01/2023   PLT 441 (H) 11/01/2023    Lab Results  Component Value Date   CREATININE 0.40 (L) 11/01/2023   BUN 7 (L) 11/01/2023   NA 120 (L) 11/01/2023   K 3.5 11/01/2023   CL 82 (L) 11/01/2023   CO2 29 11/01/2023    Lab Results  Component Value Date   ALT 54 (H) 11/01/2023   AST 40 11/01/2023   ALKPHOS 61 11/01/2023   BILITOT 0.6 11/01/2023     Microbiology: Recent Results (from the past 240 hours)  Resp panel by RT-PCR (RSV, Flu A&B, Covid) Anterior Nasal Swab     Status: None   Collection Time: 10/31/23 10:49 AM   Specimen: Anterior Nasal Swab  Result Value Ref Range Status   SARS Coronavirus 2 by RT PCR NEGATIVE NEGATIVE Final    Comment: (NOTE) SARS-CoV-2 target nucleic acids are NOT DETECTED.  The SARS-CoV-2 RNA is generally detectable in upper respiratory specimens during the acute phase of infection. The lowest concentration of SARS-CoV-2 viral copies this assay can detect is 138 copies/mL. A negative result does not preclude SARS-Cov-2 infection and should not be used as the sole basis for treatment or other patient management decisions. A negative result may occur with  improper specimen collection/handling, submission of specimen other than nasopharyngeal swab, presence of viral mutation(s) within the areas targeted by this assay, and inadequate number of viral copies(<138 copies/mL). A negative result must be combined  with clinical observations, patient history, and epidemiological information. The expected result is Negative.  Fact Sheet for Patients:  BloggerCourse.com  Fact Sheet for Healthcare Providers:  SeriousBroker.it  This test is no t yet approved or cleared by the [place] FDA and  has been authorized for detection and/or diagnosis of SARS-CoV-2 by FDA under an Emergency Use Authorization (EUA). This EUA will remain  in effect (meaning this test can be used) for the duration of the COVID-19 declaration under Section 564(b)(1) of the Act, 21 U.S.C.section 360bbb-3(b)(1), unless the authorization is terminated  or revoked sooner.       Influenza A by PCR NEGATIVE NEGATIVE Final   Influenza B by PCR NEGATIVE NEGATIVE Final    Comment: (NOTE) The Xpert Xpress SARS-CoV-2/FLU/RSV plus assay is intended as an aid in the diagnosis of influenza from Nasopharyngeal swab specimens and should not be used as a sole basis for treatment. Nasal washings and aspirates are unacceptable for Xpert Xpress SARS-CoV-2/FLU/RSV testing.  Fact Sheet for Patients: BloggerCourse.com  Fact Sheet for Healthcare Providers: SeriousBroker.it  This test is not yet approved or cleared by the [place] FDA and has been authorized for detection and/or diagnosis of SARS-CoV-2 by FDA under an Emergency Use Authorization (EUA). This EUA will remain in effect (meaning this test can be used) for the duration of the COVID-19 declaration under Section 564(b)(1) of the Act, 21 U.S.C. section 360bbb-3(b)(1), unless the authorization is terminated or revoked.     Resp Syncytial Virus by PCR NEGATIVE NEGATIVE Final    Comment: (NOTE) Fact Sheet for Patients: BloggerCourse.com  Fact Sheet for Healthcare Providers: SeriousBroker.it  This test is not yet approved  or cleared by the [place] FDA and has been authorized for detection and/or diagnosis of SARS-CoV-2 by FDA under an Emergency Use Authorization (EUA). This EUA will remain in effect (meaning this test can be used) for the duration of the COVID-19 declaration under Section 564(b)(1) of the Act, 21 U.S.C. section 360bbb-3(b)(1), unless the authorization is terminated or revoked.  Performed at [facility], [address] [place], [place] [address]     Medications: Scheduled Meds:  atorvastatin  10 mg Oral Daily   diltiazem  240 mg Oral Daily   folic acid  1 mg Oral Daily   latanoprost  1 drop Both Eyes QHS   lisinopril  5 mg Oral Daily   melatonin  10 mg Oral QHS   multivitamin with minerals  1 tablet Oral Daily   phosphorus  500 mg Oral TID   sodium chloride flush  3 mL Intravenous Q12H   thiamine  100 mg Oral Daily   timolol  1 drop Both Eyes BID   Continuous Infusions:  azithromycin Stopped (10/31/23 1845)   cefTRIAXone (ROCEPHIN)  IV Stopped (10/31/23 1845)   lactated ringers Stopped (11/01/23 1045)   PRN Meds:.acetaminophen **OR** acetaminophen, ondansetron **OR** ondansetron (ZOFRAN) IV, polyethylene glycol  Allergies  Allergen  Reactions   Codeine Nausea And Vomiting    Dizziness    Urinalysis: Recent Labs    10/31/23 1049  COLORURINE STRAW*  LABSPEC 1.012  PHURINE 8.0  GLUCOSEU NEGATIVE  HGBUR NEGATIVE  BILIRUBINUR NEGATIVE  KETONESUR NEGATIVE  PROTEINUR NEGATIVE  NITRITE NEGATIVE  LEUKOCYTESUR NEGATIVE      Imaging: DG Chest 1 View Result Date: 10/31/2023 CLINICAL DATA:  [id number] CAP (community acquired pneumonia) [id number] EXAM: CHEST  1 VIEW COMPARISON:  None Available. FINDINGS: The heart size and mediastinal contours are within normal limits. Aortic atherosclerosis. Patchy bibasilar airspace opacities, right worse than left. Probable trace bilateral pleural effusions. No pneumothorax. The visualized skeletal structures are unremarkable.  IMPRESSION: Patchy bibasilar airspace opacities, right worse than left, suspicious for multifocal pneumonia. Electronically Signed   By: Duanne Guess D.O.   On: 10/31/2023 14:13   CT ANGIO HEAD NECK W WO CM Result Date: 10/31/2023 CLINICAL DATA:  Neuro deficit, acute, stroke suspected. Intracranial hemorrhage. EXAM: CT ANGIOGRAPHY HEAD AND NECK WITH AND WITHOUT CONTRAST TECHNIQUE: Multidetector CT imaging of the head and neck was performed using the standard protocol during bolus administration of intravenous contrast. Multiplanar CT image reconstructions and MIPs were obtained to evaluate the vascular anatomy. Carotid stenosis measurements (when applicable) are obtained utilizing NASCET criteria, using the distal internal carotid diameter as the denominator. RADIATION DOSE REDUCTION: This exam was performed according to the departmental dose-optimization program which includes automated exposure control, adjustment of the mA and/or kV according to patient size and/or use of iterative reconstruction technique. CONTRAST:  75mL OMNIPAQUE IOHEXOL 350 MG/ML SOLN COMPARISON:  None Available. FINDINGS: CTA NECK FINDINGS Aortic arch: Standard branching with mild atherosclerosis. No significant stenosis of the arch vessel origins. Right carotid system: Patent with a small amount of calcified plaque at the carotid bifurcation. No evidence of a significant stenosis or dissection. Left carotid system: Patent with a small amount of calcified plaque at the carotid bifurcation. No evidence of a significant stenosis or dissection. Vertebral arteries: Patent and codominant without evidence of stenosis or dissection. Skeleton: No acute osseous abnormality or suspicious lesion. Cervical spine evaluated in detail on today's earlier dedicated spine CT. Other neck: No evidence of cervical lymphadenopathy or mass. Upper chest: Partially visualized small bilateral pleural effusions, right larger than left. Bronchial wall  thickening. Partially visualized patchy consolidation and small nodular ground-glass opacities in the right greater than left upper lobes. Mild mediastinal lymphadenopathy including a 1.1 cm short axis precarinal node and 1.5 cm subcarinal node. Review of the MIP images confirms the above findings CTA HEAD FINDINGS Anterior circulation: The internal carotid arteries are patent from skull base to carotid termini with minimal atherosclerosis not resulting in a significant stenosis. ACAs and MCAs are patent without evidence of a proximal or intra clues or significant proximal stenosis. No aneurysm or vascular malformation is identified. Posterior circulation: The intracranial vertebral arteries are widely patent to the basilar. Patent PICA, AICA, and SCA origins are visualized bilaterally. The basilar artery is widely patent. There is a large right posterior communicating artery with hypoplasia of the right P1 segment. Both PCAs are patent without evidence a significant proximal stenosis. No aneurysm or vascular malformation is identified. Venous sinuses: As permitted by contrast timing, patent. Anatomic variants: Predominantly fetal type origin of the right PCA. Dominant left A2 segment. Duplicated left MCA. Review of the MIP images confirms the above findings IMPRESSION: 1. Mild atherosclerosis without a large vessel occlusion, significant stenosis, aneurysm, or vascular malformation in the head or  neck. 2. Partially visualized small bilateral pleural effusions and patchy lung consolidation, possibly reflecting pneumonia. Recommend correlation with chest radiography. 3. Mild mediastinal lymphadenopathy, nonspecific but possibly reactive. Electronically Signed   By: Sebastian Ache M.D.   On: 10/31/2023 12:55   CT Head Wo Contrast Result Date: 10/31/2023 CLINICAL DATA:  Head trauma, minor (Age >= 65y). Fall. Dizziness. History of dementia. EXAM: CT HEAD WITHOUT CONTRAST TECHNIQUE: Contiguous axial images were obtained  from the base of the skull through the vertex without intravenous contrast. RADIATION DOSE REDUCTION: This exam was performed according to the departmental dose-optimization program which includes automated exposure control, adjustment of the mA and/or kV according to patient size and/or use of iterative reconstruction technique. COMPARISON:  None Available. FINDINGS: Brain: There is scattered small volume acute subarachnoid hemorrhage within cerebral sulci bilaterally, including over the frontal convexities near the vertex. Subarachnoid hemorrhage is also noted in the left sylvian fissure and quadrigeminal cistern. There is a 5 mm focus of intra-axial hemorrhage in the anterior left temporal lobe. No acute infarct, mass, midline shift, or extra-axial fluid collection is identified. Mild cerebral atrophy is within normal limits for age. Cerebral white matter hypodensities are nonspecific but compatible with mild chronic small vessel ischemic disease. Vascular: Calcified atherosclerosis at the skull base. No hyperdense vessel. Skull: No acute fracture or suspicious lesion. Sinuses/Orbits: Paranasal sinuses and mastoid air cells are clear. Bilateral cataract extraction. Other: None. Critical Value/emergent results were called by telephone at the time of interpretation on 10/31/2023 at 11:28 am to Dr. Roxan Hockey, who verbally acknowledged these results. IMPRESSION: 1. Small volume acute subarachnoid hemorrhage. 2. Small hemorrhagic contusion in the anterior left temporal lobe. 3. Mild chronic small vessel ischemic disease. Electronically Signed   By: Sebastian Ache M.D.   On: 10/31/2023 11:28   CT Cervical Spine Wo Contrast Result Date: 10/31/2023 CLINICAL DATA:  Neck trauma.  Fall.  Limited memory of injury. EXAM: CT CERVICAL SPINE WITHOUT CONTRAST TECHNIQUE: Multidetector CT imaging of the cervical spine was performed without intravenous contrast. Multiplanar CT image reconstructions were also generated. RADIATION DOSE  REDUCTION: This exam was performed according to the departmental dose-optimization program which includes automated exposure control, adjustment of the mA and/or kV according to patient size and/or use of iterative reconstruction technique. COMPARISON:  None Available. FINDINGS: Alignment: Physiologic. Skull base and vertebrae: No evidence of acute fracture or traumatic subluxation. Soft tissues and spinal canal: No prevertebral fluid or swelling. No visible canal hematoma. Disc levels: Multilevel spondylosis with disc space narrowing, endplate osteophytes and facet hypertrophy. No evidence of large disc herniation or high-grade foraminal narrowing. Upper chest: No acute findings. Other: Bilateral carotid atherosclerosis. Bilateral TMJ degenerative changes. IMPRESSION: 1. No evidence of acute cervical spine fracture, traumatic subluxation or static signs of instability. 2. Multilevel cervical spondylosis. Electronically Signed   By: Carey Bullocks M.D.   On: 10/31/2023 11:21      Assessment/Plan:  TANNER VIGNA is a 83 y.o. female with medical problems of   Hypertension, dementia, hyperlipidemia   was admitted on 10/31/2023 for :  Hyponatremia [E87.1]  Hyponatremia Differential seems to be tea and toast diet.  After her respiratory illness patient was not eating well.  She has been drinking mostly fluids. Currently she is getting LR. She is noted to have very low albumin of 2.1 suggesting malnutrition.  Plan: We will obtain TSH, SPEP, kappa lambda ratio Add protein to her diet in the form of nutrition supplement such as Ensure or boost. Recommend to  discontinue LR.  Patient does not appear to be volume deplete.  2.  Pneumonia Multifocal infiltrate by chest x-ray. Currently receiving broad-spectrum antibiotics-ceftriaxone and azithromycin.  3.  Fall CT head without contrast shows small volume acute subarachnoid hemorrhage   Alene Bergerson Thedore Mins 11/01/23

## 2023-11-01 NOTE — Evaluation (Signed)
 Occupational Therapy Evaluation Patient Details Name: Cassie Taylor MRN: [id number] DOB: September 14, 1940 Today's Date: 11/01/2023   History of Present Illness   Pt is an 83 y.o. female who presents to the ED due to ground-level fall. Admitted for management of hyponatremia, subarachnoid hemorrhage and CAP. PMH of dementia, hypertension, hyperlipidemia, chronic hyponatremia.     Clinical Impressions Pt was seen for OT evaluation this date. Prior to hospital admission, pt was a resident of [facility] where she lives with her husband. Per granddaughter, pt is very active and mobile at baseline going to [place] and just began using a rollator for mobility ~1 month ago d/t her back pain from scoliosis.  Pt presents to acute OT demonstrating impaired ADL performance and functional mobility 2/2 weakness, cognitive deficits, low activity tolerance and mild balance deficits. She denies pain, is alert and able to state her name and DOB. Reports living with her husband, but unable to provide further details and states "my brain isn't working today I can't answer all your questions." History obtained from granddaughter who arrived at end of session. Pt currently requires CGA for STS and mobility to the bathroom and back using RW ~30-40 feet. Toileting hygiene and clothing management performed with CGA/SBA and no LOB during session. Pt reports mild dizziness, but BP WFL. Sp02 on RA 90-95% throughout eval and does not wear 02 at baseline. Returned to supine with SUP.  Pt would benefit from skilled OT services to address noted impairments and functional limitations (see below for any additional details) in order to maximize safety and independence while minimizing falls risk and caregiver burden. Do anticipate the need for follow up OT services upon acute hospital DC.      If plan is discharge home, recommend the following:   A little help with walking and/or transfers;A little help with  bathing/dressing/bathroom;Direct supervision/assist for financial management;Supervision due to cognitive status;Direct supervision/assist for medications management     Functional Status Assessment   Patient has had a recent decline in their functional status and demonstrates the ability to make significant improvements in function in a reasonable and predictable amount of time.     Equipment Recommendations   BSC/3in1     Recommendations for Other Services         Precautions/Restrictions   Precautions Precautions: Fall Restrictions Weight Bearing Restrictions Per Provider Order: No     Mobility Bed Mobility Overal bed mobility: Needs Assistance Bed Mobility: Sit to Supine       Sit to supine: Supervision   General bed mobility comments: no physical assist provided and pt seated EOB on entry to room    Transfers Overall transfer level: Needs assistance Equipment used: Rolling walker (2 wheels) Transfers: Sit to/from Stand Sit to Stand: Contact guard assist           General transfer comment: CGA/SUP for STS from EOB and CGA for mobility to bathroom across the hall and back      Balance Overall balance assessment: Needs assistance Sitting-balance support: Feet supported Sitting balance-Leahy Scale: Good     Standing balance support: Reliant on assistive device for balance, Bilateral upper extremity supported Standing balance-Leahy Scale: Fair Standing balance comment: CGA and RW use, no LOB during mobility but admits to dizziness on/off                           ADL either performed or assessed with clinical judgement   ADL Overall ADL's :  Needs assistance/impaired                         Toilet Transfer: Contact guard assist;Grab bars;Rolling walker (2 wheels)   Toileting- Clothing Manipulation and Hygiene: Supervision/safety;Sitting/lateral lean       Functional mobility during ADLs: Contact guard assist;Rolling  walker (2 wheels)       Vision         Perception         Praxis         Pertinent Vitals/Pain Pain Assessment Pain Assessment: No/denies pain     Extremity/Trunk Assessment Upper Extremity Assessment Upper Extremity Assessment: Generalized weakness   Lower Extremity Assessment Lower Extremity Assessment: Generalized weakness       Communication Communication Communication: No apparent difficulties   Cognition Arousal: Alert Behavior During Therapy: WFL for tasks assessed/performed Cognition: History of cognitive impairments, Cognition impaired   Orientation impairments: Person         OT - Cognition Comments: able to state name and DOB and that she lives with her spouse; other history obtained from granddaughter                 Following commands: Intact       Cueing  General Comments   Cueing Techniques: Verbal cues  dizziness on/off with BP WFL, on RA sp02 90-95%   Exercises Other Exercises Other Exercises: Edu on role of OT in acute setting.   Shoulder Instructions      Home Living Family/patient expects to be discharged to:: Assisted living                             Home Equipment: Rollator (4 wheels)   Additional Comments: per granddaughter likely handicapped accessible bathroom as pt and her spouse live together at ALF and he has more physical needs      Prior Functioning/Environment Prior Level of Function : Independent/Modified Independent;History of Falls (last six months)             Mobility Comments: IND prior to a month ago, but recently began using rollator 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; 1 fall which led to this hospitalization; walks the halls of ALF and participates in [employer]umb[employer] ADLs Comments: MOD I/IND with ADLs    OT Problem List: Decreased strength;Impaired balance (sitting and/or standing);Decreased cognition   OT Treatment/Interventions: Self-care/ADL training;Therapeutic exercise;Balance  training;Therapeutic activities;DME and/or AE instruction;Patient/family education      OT Goals(Current goals can be found in the care plan section)   Acute Rehab OT Goals Patient Stated Goal: return to ALF OT Goal Formulation: With patient/family Time For Goal Achievement: 11/15/23 Potential to Achieve Goals: Good ADL Goals Pt Will Perform Lower Body Bathing: with supervision;with modified independence;sit to/from stand;sitting/lateral leans Pt Will Perform Lower Body Dressing: with supervision;with modified independence;sit to/from stand;sitting/lateral leans Pt Will Transfer to Toilet: with supervision;with modified independence;ambulating;regular height toilet Pt Will Perform Toileting - Clothing Manipulation and hygiene: with modified independence;with supervision;sit to/from stand;sitting/lateral leans   OT Frequency:  Min 2X/week    Co-evaluation              AM-PAC OT "6 Clicks" Daily Activity     Outcome Measure Help from another person eating meals?: None Help from another person taking care of personal grooming?: A Little Help from another person toileting, which includes using toliet, bedpan, or urinal?: A Little Help from another person bathing (including washing,  rinsing, drying)?: A Little Help from another person to put on and taking off regular upper body clothing?: None Help from another person to put on and taking off regular lower body clothing?: A Little 6 Click Score: 20   End of Session Equipment Utilized During Treatment: Rolling walker (2 wheels) Nurse Communication: Mobility status  Activity Tolerance: Patient tolerated treatment well Patient left: in bed;with call bell/phone within reach;with bed alarm set;with family/visitor present;with nursing/sitter in room  OT Visit Diagnosis: Other abnormalities of gait and mobility (R26.89);Unsteadiness on feet (R26.81);Muscle weakness (generalized) (M62.81)                Time: 8657-8469 OT Time  Calculation (min): 22 min Charges:  OT General Charges $OT Visit: 1 Visit OT Evaluation $OT Eval Moderate Complexity: 1 Mod OT Treatments $Self Care/Home Management : 8-22 mins Aurel Nguyen, OTR/L 11/01/23, 2:09 PM  Presleigh Feldstein E Chatara Lucente 11/01/2023, 2:05 PM

## 2023-11-02 DIAGNOSIS — E871 Hypo-osmolality and hyponatremia: Secondary | ICD-10-CM | POA: Diagnosis not present

## 2023-11-02 DIAGNOSIS — Z515 Encounter for palliative care: Secondary | ICD-10-CM | POA: Diagnosis not present

## 2023-11-02 DIAGNOSIS — I609 Nontraumatic subarachnoid hemorrhage, unspecified: Secondary | ICD-10-CM | POA: Diagnosis not present

## 2023-11-02 DIAGNOSIS — F0390 Unspecified dementia without behavioral disturbance: Secondary | ICD-10-CM | POA: Diagnosis not present

## 2023-11-02 LAB — BASIC METABOLIC PANEL WITH GFR
Anion gap: 9 (ref 5–15)
BUN: 5 mg/dL — ABNORMAL LOW (ref 8–23)
CO2: 30 mmol/L (ref 22–32)
Calcium: 8 mg/dL — ABNORMAL LOW (ref 8.9–10.3)
Chloride: 85 mmol/L — ABNORMAL LOW (ref 98–111)
Creatinine, Ser: 0.42 mg/dL — ABNORMAL LOW (ref 0.44–1.00)
GFR, Estimated: >60 mL/min (ref >60)
Glucose, Bld: 107 mg/dL — ABNORMAL HIGH (ref 70–99)
Potassium: 3.3 mmol/L — ABNORMAL LOW (ref 3.5–5.1)
Sodium: 124 mmol/L — ABNORMAL LOW (ref 135–145)

## 2023-11-02 LAB — CBC
HCT: 34.9 % — ABNORMAL LOW (ref 36.0–46.0)
Hemoglobin: 12.6 g/dL (ref 12.0–15.0)
MCH: 31.3 pg (ref 26.0–34.0)
MCHC: 36.1 g/dL — ABNORMAL HIGH (ref 30.0–36.0)
MCV: 86.6 fL (ref 80.0–100.0)
Platelets: 469 10*3/uL — ABNORMAL HIGH (ref 150–400)
RBC: 4.03 MIL/uL (ref 3.87–5.11)
RDW: 12.3 % (ref 11.5–15.5)
WBC: 11.7 10*3/uL — ABNORMAL HIGH (ref 4.0–10.5)
nRBC: 0 % (ref 0.0–0.2)

## 2023-11-02 LAB — PHOSPHORUS: Phosphorus: 2.9 mg/dL (ref 2.5–4.6)

## 2023-11-02 LAB — MAGNESIUM: Magnesium: 1.9 mg/dL (ref 1.7–2.4)

## 2023-11-02 MED ORDER — POTASSIUM CHLORIDE CRYS ER 20 MEQ PO TBCR
40.0000 meq | EXTENDED_RELEASE_TABLET | Freq: Once | ORAL | Status: AC
  Administered 2023-11-02: 40 meq via ORAL
  Filled 2023-11-02: qty 2

## 2023-11-02 MED ORDER — GUAIFENESIN ER 600 MG PO TB12: 600.0000 mg | ORAL_TABLET | Freq: Two times a day (BID) | ORAL | Status: DC

## 2023-11-02 MED ORDER — GUAIFENESIN ER 600 MG PO TB12
600.0000 mg | ORAL_TABLET | Freq: Two times a day (BID) | ORAL | Status: DC
  Administered 2023-11-02 – 2023-11-03 (×3): 600 mg via ORAL
  Filled 2023-11-02 (×3): qty 1

## 2023-11-02 MED ORDER — LORATADINE 10 MG PO TABS
10.0000 mg | ORAL_TABLET | Freq: Every day | ORAL | Status: AC
  Administered 2023-11-02 – 2023-11-06 (×5): 10 mg via ORAL
  Filled 2023-11-02 (×5): qty 1

## 2023-11-02 MED ORDER — ENSURE ENLIVE PO LIQD
237.0000 mL | Freq: Three times a day (TID) | ORAL | Status: DC
  Administered 2023-11-02 – 2023-11-06 (×10): 237 mL via ORAL

## 2023-11-02 NOTE — Plan of Care (Signed)
   Problem: Activity: Goal: Risk for activity intolerance will decrease Outcome: Progressing

## 2023-11-02 NOTE — Progress Notes (Signed)
 [facility] Progress Note  Patient: Cassie Taylor    ZOX:[id number]  DOA: 10/31/2023     Date of Service: the patient was seen and examined on 11/02/2023  Chief Complaint  Patient presents with   Fall        Brief hospital course:  Cassie Taylor is a 83 y.o. female with medical history significant of dementia, hypertension, hyperlipidemia, chronic hyponatremia, who presents to the ED due to ground-level fall.   History obtained through chart review and from patient's daughter-in-law at bedside, given patient's underlying dementia.  Per chart review, Patient had a ground-level fall earlier today that was unwitnessed.  Facility staff were not sure if she hit her head, however she complained of her head hurting.  The facility noted she had been also complaining of dizziness for the last few days.  This is after recent diagnosis of RSV and prolonged poor appetite afterwards.  RSV diagnosis was approximately 10 days ago.   At this time, [name] denies any pain including chest pain, abdominal pain.  She denies any shortness of breath, cough, nausea, vomiting, diarrhea.  She denies any dizziness, headache or focal weakness.   ED course: On arrival to the ED, patient was normotensive at 134/67 with heart rate of 72.  She was saturating at 93% on room air.  She was afebrile at 97.9.  Initial workup notable for WBC of 15.9, platelets 442, sodium 117, potassium 2.7, BUN 7, creatinine 0.51, bicarb 33, AST 49, ALT 65, GFR above 60.  Troponin negative.  COVID-19, influenza and RSV PCR negative.  Urinalysis negative.  CT of the head notable for small volume acute subarachnoid hemorrhage with small hemorrhagic contusion in the anterior left temporal lobe.  Neurosurgery consulted, recommended CTA, and if negative, no further recommendations.  CTA ordered and pending.  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 Serum osmolality 247 low S/p NS bolus in the ED, patient was on LR which has been discontinued. Continue fluid restriction 1.5 L/day Na 117--120--124 Continue to trend sodium level Sodium correction range 8-10 mmol in 24 hours Nephrology consult appreciated  # Hypokalemia, potassium repleted. # Hypophosphatemia, Phos repleted. Monitor electrolytes and replete as needed.  # Community-acquired pneumonia CXR: Patchy bibasilar airspace opacities, right worse than left, suspicious for multifocal pneumonia. Continue ceftriaxone and azithromycin 3 days Procalcitonin negative Trend WBC count Started Mucinex and Claritin for symptomatic management  # Subarachnoid hemorrhage s/p ground-level fall CT head positive for subarachnoid hemorrhage CTA head negative for any large vessel occlusion, no aneurysms. Seen by neurosurgery, recommended no intervention if no abnormality on CT angiogram Patient may follow-up with neurosurgery as an outpatient   # Hypertension Continue Cardizem and lisinopril Held HCTZ due to hyponatremia Monitor BP and titrate medications accordingly   Body mass index is 18.47 kg/m.  Interventions:  Diet: Regular diet, fluid striction 1.5 L/day DVT Prophylaxis: Therapeutic Anticoagulation with subarachnoid hemorrhage    Advance goals of care discussion: Full code  Family Communication: family was present at bedside, at the time of interview.  The pt provided permission to discuss medical plan with the family. Opportunity was given to ask question and all questions were answered satisfactorily.   Disposition:  Pt is from ALF, admitted with fall, SAH and Hyponatremia, still has low Na, which precludes a safe discharge. Discharge to ALF vs SNF TBD , when stable, may need few  days to improve.  Subjective: No significant events overnight, patient was lying comfortably in the bed, stated that she just feels lazy, denies any  headache or dizziness. Significant dementia, AO x 1  Physical Exam: General: NAD, lying comfortably Appear in no distress, affect appropriate Eyes: PERRLA ENT: Oral Mucosa Clear, moist  Neck: no JVD,  Cardiovascular: S1 and S2 Present, no Murmur,  Respiratory: good respiratory effort, Bilateral Air entry equal and Decreased, no Crackles, no wheezes Abdomen: Bowel Sound present, Soft and no tenderness,  Skin: no rashes Extremities: no Pedal edema, no calf tenderness Neurologic: without any new focal findings Gait not checked due to patient safety concerns  Vitals:   11/02/23 0328 11/02/23 0729 11/02/23 1132 11/02/23 1514  BP: (!) 143/76 127/70 135/72 119/67  Pulse: 83 77 84 94  Resp: 17 20 18 18   Temp: 98.4 F (36.9 C) 98.6 F (37 C) 97.8 F (36.6 C) 98.6 F (37 C)  TempSrc:      SpO2: 98% 91% 97% 98%  Weight:      Height:       No intake or output data in the 24 hours ending 11/02/23 1614  Filed Weights   10/31/23 1050  Weight: 45.8 kg    Data Reviewed: I have personally reviewed and interpreted daily labs, tele strips, imagings as discussed above. I reviewed all nursing notes, pharmacy notes, vitals, pertinent old records I have discussed plan of care as described above with RN and patient/family.  CBC: Recent Labs  Lab 10/31/23 1048 11/01/23 0436 11/02/23 0557  WBC 15.9* 15.6* 11.7*  NEUTROABS 13.1*  --   --   HGB 12.0 11.6* 12.6  HCT 33.6* 32.1* 34.9*  MCV 87.5 88.4 86.6  PLT 442* 441* 469*   Basic Metabolic Panel: Recent Labs  Lab 10/31/23 1048 10/31/23 1850 11/01/23 0436 11/01/23 1628 11/02/23 0557  NA 117* 122* 120* 123* 124*  K 2.7* 3.2* 3.5 3.3* 3.3*  CL 75* 88* 82* 84* 85*  CO2 33* 25 29 31 30   GLUCOSE 102* 115* 88 101* 107*  BUN 7* 9 7* 9 5*  CREATININE 0.51 0.42* 0.40* 0.44 0.42*  CALCIUM 8.1* 6.9* 7.8* 7.6* 8.0*  MG 2.0  --  1.8  --  1.9  PHOS  --   --  2.3*  --  2.9    Studies: No results found.   Scheduled Meds:   atorvastatin  10 mg Oral Daily   diltiazem  240 mg Oral Daily   feeding supplement  237 mL Oral TID BM   folic acid  1 mg Oral Daily   guaiFENesin  600 mg Oral BID   latanoprost  1 drop Both Eyes QHS   lisinopril  5 mg Oral Daily   loratadine  10 mg Oral Daily   melatonin  10 mg Oral QHS   multivitamin with minerals  1 tablet Oral Daily   sodium chloride flush  3 mL Intravenous Q12H   thiamine  100 mg Oral Daily   timolol  1 drop Both Eyes BID   Continuous Infusions:  cefTRIAXone (ROCEPHIN)  IV 2 g (11/02/23 1333)   PRN Meds: acetaminophen **OR** acetaminophen, haloperidol lactate, ondansetron **OR** ondansetron (ZOFRAN) IV, polyethylene glycol  Time spent: 40 minutes  Author: Gillis Santa. MD [facility] 11/02/2023 4:14 PM  To reach On-call, see care teams to locate the attending and reach out to them via [email or web address]. If 7PM-7AM, please contact night-coverage If you still have difficulty reaching the attending provider, please  page the [facility] (Director on Call) for [facility] on amion for assistance.

## 2023-11-02 NOTE — TOC Progression Note (Signed)
 Transition of Care [facility]) - Progression Note    Patient Details  Name: Cassie Taylor MRN: [id number] Date of Birth: 1941-08-01  [facility]) CM/SW Contact  Truddie Hidden, RN Phone Number: 11/02/2023, 2:52 PM  Clinical Narrative:    Per message received from nurse, patient's son is requesting call from [facility].   Attempt to reach patient's son, Arlys John. No answer. Left a message.          Expected Discharge Plan and Services                                               Social Determinants of Health (SDOH) Interventions SDOH Screenings   Food Insecurity: No Food Insecurity (11/01/2023)  Housing: Low Risk  (11/01/2023)  Transportation Needs: No Transportation Needs (11/01/2023)  Utilities: Not At Risk (11/01/2023)  Financial Resource Strain: Low Risk  (01/15/2023)   Received from [facility]  Social Connections: Unknown (11/01/2023)  Tobacco Use: Low Risk  (10/31/2023)    Readmission Risk Interventions     No data to display

## 2023-11-02 NOTE — Progress Notes (Addendum)
 [facility]  ROUNDING NOTE   Subjective:   Patient seen sitting up in bed No family present Partially completed breakfast tray at bedside Patient repeatedly coughing and blowing her nerves, states she is trying to release the congestion.   Sodium 124  Objective:  Vital signs in last 24 hours:  Temp:  [97.8 F (36.6 C)-98.6 F (37 C)] 97.8 F (36.6 C) (04/01 1132) Pulse Rate:  [75-86] 84 (04/01 1132) Resp:  [17-20] 18 (04/01 1132) BP: (116-154)/(60-80) 135/72 (04/01 1132) SpO2:  [91 %-99 %] 97 % (04/01 1132)  Weight change:  Filed Weights   10/31/23 1050  Weight: 45.8 kg    Intake/Output: I/O last 3 completed shifts: In: 1000 [I.V.:1000] Out: -    Intake/Output this shift:  No intake/output data recorded.  Physical Exam: General: NAD  Head: Normocephalic, atraumatic. Moist oral mucosal membranes  Eyes: Anicteric  Lungs:  Clear to auscultation, normal effort  Heart: Regular rate and rhythm  Abdomen:  Soft, nontender, nondistended  Extremities: No peripheral edema.  Neurologic: Alert and oriented to self, moving all four extremities  Skin: No lesions       Basic Metabolic Panel: Recent Labs  Lab 10/31/23 1048 10/31/23 1850 11/01/23 0436 11/01/23 1628 11/02/23 0557  NA 117* 122* 120* 123* 124*  K 2.7* 3.2* 3.5 3.3* 3.3*  CL 75* 88* 82* 84* 85*  CO2 33* 25 29 31 30   GLUCOSE 102* 115* 88 101* 107*  BUN 7* 9 7* 9 5*  CREATININE 0.51 0.42* 0.40* 0.44 0.42*  CALCIUM 8.1* 6.9* 7.8* 7.6* 8.0*  MG 2.0  --  1.8  --  1.9  PHOS  --   --  2.3*  --  2.9    Liver Function Tests: Recent Labs  Lab 10/31/23 1048 11/01/23 0436  AST 49* 40  ALT 65* 54*  ALKPHOS 74 61  BILITOT 0.8 0.6  PROT 6.4* 5.4*  ALBUMIN 2.6* 2.1*   No results for input(s): "LIPASE", "AMYLASE" in the last 168 hours. No results for input(s): "AMMONIA" in the last 168 hours.  CBC: Recent Labs  Lab 10/31/23 1048 11/01/23 0436 11/02/23 0557  WBC 15.9* 15.6* 11.7*   NEUTROABS 13.1*  --   --   HGB 12.0 11.6* 12.6  HCT 33.6* 32.1* 34.9*  MCV 87.5 88.4 86.6  PLT 442* 441* 469*    Cardiac Enzymes: No results for input(s): "CKTOTAL", "CKMB", "CKMBINDEX", "TROPONINI" in the last 168 hours.  BNP: Invalid input(s): "POCBNP"  CBG: No results for input(s): "GLUCAP" in the last 168 hours.  Microbiology: Results for orders placed or performed during the hospital encounter of 10/31/23  Resp panel by RT-PCR (RSV, Flu A&B, Covid) Anterior Nasal Swab     Status: None   Collection Time: 10/31/23 10:49 AM   Specimen: Anterior Nasal Swab  Result Value Ref Range Status   SARS Coronavirus 2 by RT PCR NEGATIVE NEGATIVE Final    Comment: (NOTE) SARS-CoV-2 target nucleic acids are NOT DETECTED.  The SARS-CoV-2 RNA is generally detectable in upper respiratory specimens during the acute phase of infection. The lowest concentration of SARS-CoV-2 viral copies this assay can detect is 138 copies/mL. A negative result does not preclude SARS-Cov-2 infection and should not be used as the sole basis for treatment or other patient management decisions. A negative result may occur with  improper specimen collection/handling, submission of specimen other than nasopharyngeal swab, presence of viral mutation(s) within the areas targeted by this assay, and inadequate number of viral  copies(<138 copies/mL). A negative result must be combined with clinical observations, patient history, and epidemiological information. The expected result is Negative.  Fact Sheet for Patients:  BloggerCourse.com  Fact Sheet for Healthcare Providers:  SeriousBroker.it  This test is no t yet approved or cleared by the [place] FDA and  has been authorized for detection and/or diagnosis of SARS-CoV-2 by FDA under an Emergency Use Authorization (EUA). This EUA will remain  in effect (meaning this test can be used) for the duration of  the COVID-19 declaration under Section 564(b)(1) of the Act, 21 U.S.C.section 360bbb-3(b)(1), unless the authorization is terminated  or revoked sooner.       Influenza A by PCR NEGATIVE NEGATIVE Final   Influenza B by PCR NEGATIVE NEGATIVE Final    Comment: (NOTE) The Xpert Xpress SARS-CoV-2/FLU/RSV plus assay is intended as an aid in the diagnosis of influenza from Nasopharyngeal swab specimens and should not be used as a sole basis for treatment. Nasal washings and aspirates are unacceptable for Xpert Xpress SARS-CoV-2/FLU/RSV testing.  Fact Sheet for Patients: BloggerCourse.com  Fact Sheet for Healthcare Providers: SeriousBroker.it  This test is not yet approved or cleared by the [place] FDA and has been authorized for detection and/or diagnosis of SARS-CoV-2 by FDA under an Emergency Use Authorization (EUA). This EUA will remain in effect (meaning this test can be used) for the duration of the COVID-19 declaration under Section 564(b)(1) of the Act, 21 U.S.C. section 360bbb-3(b)(1), unless the authorization is terminated or revoked.     Resp Syncytial Virus by PCR NEGATIVE NEGATIVE Final    Comment: (NOTE) Fact Sheet for Patients: BloggerCourse.com  Fact Sheet for Healthcare Providers: SeriousBroker.it  This test is not yet approved or cleared by the [place] FDA and has been authorized for detection and/or diagnosis of SARS-CoV-2 by FDA under an Emergency Use Authorization (EUA). This EUA will remain in effect (meaning this test can be used) for the duration of the COVID-19 declaration under Section 564(b)(1) of the Act, 21 U.S.C. section 360bbb-3(b)(1), unless the authorization is terminated or revoked.  Performed at [facility], [address] [place], [place] [address]     Coagulation Studies: No results for input(s): "LABPROT", "INR"  in the last 72 hours.  Urinalysis: Recent Labs    10/31/23 1049  COLORURINE STRAW*  LABSPEC 1.012  PHURINE 8.0  GLUCOSEU NEGATIVE  HGBUR NEGATIVE  BILIRUBINUR NEGATIVE  KETONESUR NEGATIVE  PROTEINUR NEGATIVE  NITRITE NEGATIVE  LEUKOCYTESUR NEGATIVE      Imaging: No results found.   Medications:    azithromycin 500 mg (11/02/23 1414)   cefTRIAXone (ROCEPHIN)  IV 2 g (11/02/23 1333)    atorvastatin  10 mg Oral Daily   diltiazem  240 mg Oral Daily   feeding supplement  237 mL Oral TID BM   folic acid  1 mg Oral Daily   guaiFENesin  600 mg Oral BID   latanoprost  1 drop Both Eyes QHS   lisinopril  5 mg Oral Daily   loratadine  10 mg Oral Daily   melatonin  10 mg Oral QHS   multivitamin with minerals  1 tablet Oral Daily   sodium chloride flush  3 mL Intravenous Q12H   thiamine  100 mg Oral Daily   timolol  1 drop Both Eyes BID   acetaminophen **OR** acetaminophen, haloperidol lactate, ondansetron **OR** ondansetron (ZOFRAN) IV, polyethylene glycol  Assessment/ Plan:  Ms. Cassie Taylor is a 83 y.o.  female  with  medical problems of dementia, hypertension, hyperlipidemia.   Hyponatremia Differential seems to be tea and toast diet.  After her respiratory illness patient was not eating well.  She has been drinking mostly fluids. She is noted to have very low albumin of 2.1 suggesting malnutrition. Sodium has corrected to 124.  TSH normal.  SPEP and kappa lambda pending.  Patient encouraged to monitor and limit fluid intake.  Encouraged to consume boost or Ensure between meals.   2.  Pneumonia Multifocal infiltrate by chest x-ray. Currently receiving broad-spectrum antibiotics-ceftriaxone and azithromycin. Primary team will continue management.   3.  Fall CT head without contrast shows small volume acute subarachnoid hemorrhage.  Neurology recommends no interventions and will follow-up outpatient.   LOS: 2 Cassie Taylor 4/1/20252:50 PM   Patient was seen  and examined with Wendee Beavers, NP.  Plan of care was formulated for the problems addressed and discussed with NP.  I agree with the note as documented except as noted below.

## 2023-11-02 NOTE — Plan of Care (Signed)

## 2023-11-02 NOTE — Consult Note (Signed)
 Consultation Note Date: 11/02/2023 at 1200  Patient Name: [place]  DOB: Apr 23, 1941  MRN: [id number]  Age / Sex: 83 y.o., female  PCP: Nonda Lou, MD Referring Physician: Gillis Santa, MD  HPI/Patient Profile: 83 y.o. female  with past medical history of dementia, hypertension, hyperlipidemia, chronic hyponatremia admitted on 10/31/2023 with ground-level fall.  Upon arrival to ED, CT of the head was notable for small volume acute subarachnoid hemorrhage with small hemorrhagic contusion in the anterior left temporal lobe.  Neurosurgery was consulted and recommended CTA.  CTA was negative for any large vessel occlusions and no aneurysms present.  Patient is being treated for hyponatremia.  Nephrology was consulted.  PMT was consulted to support patient and family with goals of care discussions.   Clinical Assessment and Goals of Care: Extensive chart review completed prior to meeting patient including labs, vital signs, imaging, progress notes, orders, and available advanced directive documents from current and previous encounters. I then met with patient at bedside.  She is awake and alert to self.  She cannot share with me why she is in the hospital.  She is pleasant but unable to participate in goals of care medical decision making independently at this time.  She was able to engage in a brief life review.  She knows that he is married, she has 1 son, and "a few grandchildren I think".  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  Symptoms assessed.  Patient has no acute complaints at this time.  She denies chest pain, headache, nausea, vomiting, or other acute issues at this time.  No adjustment to [facility] needed.  After visiting with the patient, I spoke with patient's son [place] over the phone to discuss diagnosis prognosis, GOC, EOL wishes, disposition and  options.  I introduced Palliative Medicine as specialized medical care for people living with serious illness. It focuses on providing relief from the symptoms and stress of a serious illness. The goal is to improve quality of life for both the patient and the family.  As far as functional and nutritional status [place] endorses a significant decline in his mother over the past month or so.  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  He shares that she ate like a bird but was not eating much recently.  He shares she is not as mobile and having more cognitive difficulty over the past several weeks as well.  Of note, patient's husband is frail and lives in the nursing home with her.  [place] shares they are both deteriorating quickly and simultaneously.  Discussed dementia as a chronic, progressive, and irreversible disease that is often exacerbated by acute illness and hospitalizations.  We discussed patient's current illness and what it means in the larger context of patient's on-going co-morbidities.  Natural disease trajectory discussed.  I attempted to elicit values and goals of care important to the patient.  [place] references that he is the [facility] for  his mother.  He also shares that she has created a living will and advanced directive. Advance directives, concepts specific to code status, artificial feeding and hydration, and rehospitalization were considered and discussed.  He has sent it via email for review to patient's chart.  He believes that his mother would want to be a DNR with limited interventions. Encouraged [place] to consider DNR/DNI status understanding evidenced based poor outcomes in similar hospitalized patients, as the cause of the arrest is likely associated with chronic/terminal disease rather than a reversible acute cardio-pulmonary event.    Quality of life versus quantity of life discussed.  Space and opportunity  provided for [place] to share his thoughts and emotions regarding his mother's current medical situation.  He believes she has had a poor quality of life for some time.  He is understanding that her condition will not improve but will continue to worsen.  He believes he would want her to be a DNR but would like the medical team to review her paperwork prior to making any changes.  Full code remains.  Education offered regarding concept specific to human mortality and the limitations of medical interventions to prolong life when the body begins to fail to thrive.  [place] inquired about prognosis for patients like his mother with dementia.  Discussed that oftentimes end stage dementia comes when patient's stopping eating/drinking, losing bowel/urine control, speaking fewer words, being unable/unwilling to walk/get OOB.  His mother has not entered these final stages of dementia.  However, he was grateful for the prognosis.   Discussed with [place] the importance of continued conversation with family and the medical providers regarding overall plan of care and treatment options, ensuring decisions are within the context of the patient's values and GOCs.    Questions and concerns were addressed.  PMT contact info given for [place] to reach out to palliative medicine team for any acute palliative needs.  We agreed that I would review the paperwork after he sends it and we would touch base again tomorrow.  PMT will continue to follow and support patient throughout her hospitalization.  Primary Decision Maker HCPOA  Physical Exam Vitals reviewed.  Constitutional:      General: She is not in acute distress.    Appearance: She is normal weight.  HENT:     Head: Normocephalic.     Mouth/Throat:     Mouth: Mucous membranes are moist.  Eyes:     Pupils: Pupils are equal, round, and reactive to light.  Cardiovascular:     Rate and Rhythm: Normal rate.  Pulmonary:     Effort: Pulmonary effort is normal.   Abdominal:     Palpations: Abdomen is soft.  Neurological:     Mental Status: She is alert.     Comments: Oriented to self  Psychiatric:        Behavior: Behavior normal.        Judgment: Judgment normal.     Palliative Assessment/Data: 40-50%     Thank you for this consult. Palliative medicine will continue to follow and assist holistically.   Time Total: 75 minutes  Time spent includes: Detailed review of medical records (labs, imaging, vital signs), medically appropriate exam (mental status, respiratory, cardiac, skin), discussed with treatment team, counseling and educating patient, family and staff, documenting clinical information, medication management and coordination of care.  Signed by: Georgiann Cocker, DNP, FNP-BC Palliative Medicine   Please contact Palliative Medicine Team providers via [facility] for questions and concerns.

## 2023-11-02 NOTE — Progress Notes (Signed)
 PT Cancellation Note  Patient Details Name: [place] MRN: [id number] DOB: 06-11-1941   Cancelled Treatment:    Reason Eval/Treat Not Completed: Other (comment) (chart reviewed, RN consulted. Attempted to evaluate. Pt in bed coughing near constantly. Pills remain in cup on table. Author will return later in day and attempt again.)  11:53 AM, 11/02/23 [place], PT, DPT Physical Therapist - [facility]  [phone] (ASCOM)    Saahir Prude C 11/02/2023, 11:53 AM

## 2023-11-03 DIAGNOSIS — E871 Hypo-osmolality and hyponatremia: Secondary | ICD-10-CM | POA: Diagnosis not present

## 2023-11-03 DIAGNOSIS — E876 Hypokalemia: Secondary | ICD-10-CM | POA: Diagnosis not present

## 2023-11-03 DIAGNOSIS — I609 Nontraumatic subarachnoid hemorrhage, unspecified: Secondary | ICD-10-CM | POA: Diagnosis not present

## 2023-11-03 DIAGNOSIS — Z66 Do not resuscitate: Secondary | ICD-10-CM

## 2023-11-03 DIAGNOSIS — F0390 Unspecified dementia without behavioral disturbance: Secondary | ICD-10-CM | POA: Diagnosis not present

## 2023-11-03 LAB — PROTEIN ELECTROPHORESIS, SERUM
A/G Ratio: 0.8 (ref 0.7–1.7)
Albumin ELP: 2.3 g/dL — ABNORMAL LOW (ref 2.9–4.4)
Alpha-1-Globulin: 0.4 g/dL (ref 0.0–0.4)
Alpha-2-Globulin: 1 g/dL (ref 0.4–1.0)
Beta Globulin: 0.9 g/dL (ref 0.7–1.3)
Gamma Globulin: 0.8 g/dL (ref 0.4–1.8)
Globulin, Total: 3 g/dL (ref 2.2–3.9)
Total Protein ELP: 5.3 g/dL — ABNORMAL LOW (ref 6.0–8.5)

## 2023-11-03 LAB — BASIC METABOLIC PANEL WITH GFR
Anion gap: 7 (ref 5–15)
Anion gap: 9 (ref 5–15)
BUN: 7 mg/dL — ABNORMAL LOW (ref 8–23)
BUN: 6 mg/dL — ABNORMAL LOW (ref 8–23)
CO2: 27 mmol/L (ref 22–32)
CO2: 24 mmol/L (ref 22–32)
Calcium: 7.7 mg/dL — ABNORMAL LOW (ref 8.9–10.3)
Calcium: 7.9 mg/dL — ABNORMAL LOW (ref 8.9–10.3)
Chloride: 86 mmol/L — ABNORMAL LOW (ref 98–111)
Chloride: 86 mmol/L — ABNORMAL LOW (ref 98–111)
Creatinine, Ser: 0.33 mg/dL — ABNORMAL LOW (ref 0.44–1.00)
Creatinine, Ser: 0.4 mg/dL — ABNORMAL LOW (ref 0.44–1.00)
GFR, Estimated: >60 mL/min (ref >60)
GFR, Estimated: >60 mL/min (ref >60)
Glucose, Bld: 104 mg/dL — ABNORMAL HIGH (ref 70–99)
Glucose, Bld: 146 mg/dL — ABNORMAL HIGH (ref 70–99)
Potassium: 3.8 mmol/L (ref 3.5–5.1)
Potassium: 3.9 mmol/L (ref 3.5–5.1)
Sodium: 120 mmol/L — ABNORMAL LOW (ref 135–145)
Sodium: 119 mmol/L — CL (ref 135–145)

## 2023-11-03 LAB — SODIUM: Sodium: 124 mmol/L — ABNORMAL LOW (ref 135–145)

## 2023-11-03 LAB — HEPATIC FUNCTION PANEL
ALT: 65 U/L — ABNORMAL HIGH (ref 0–44)
AST: 45 U/L — ABNORMAL HIGH (ref 15–41)
Albumin: 2.5 g/dL — ABNORMAL LOW (ref 3.5–5.0)
Alkaline Phosphatase: 70 U/L (ref 38–126)
Bilirubin, Direct: 0.1 mg/dL (ref 0.0–0.2)
Indirect Bilirubin: 0.5 mg/dL (ref 0.3–0.9)
Total Bilirubin: 0.6 mg/dL (ref 0.0–1.2)
Total Protein: 5.9 g/dL — ABNORMAL LOW (ref 6.5–8.1)

## 2023-11-03 LAB — CBC
HCT: 32.1 % — ABNORMAL LOW (ref 36.0–46.0)
Hemoglobin: 11.6 g/dL — ABNORMAL LOW (ref 12.0–15.0)
MCH: 31.4 pg (ref 26.0–34.0)
MCHC: 36.1 g/dL — ABNORMAL HIGH (ref 30.0–36.0)
MCV: 87 fL (ref 80.0–100.0)
Platelets: 434 10*3/uL — ABNORMAL HIGH (ref 150–400)
RBC: 3.69 MIL/uL — ABNORMAL LOW (ref 3.87–5.11)
RDW: 12.5 % (ref 11.5–15.5)
WBC: 13.4 10*3/uL — ABNORMAL HIGH (ref 4.0–10.5)
nRBC: 0 % (ref 0.0–0.2)

## 2023-11-03 LAB — PHOSPHORUS: Phosphorus: 2.6 mg/dL (ref 2.5–4.6)

## 2023-11-03 LAB — KAPPA/LAMBDA LIGHT CHAINS
Kappa free light chain: 20.6 mg/L — ABNORMAL HIGH (ref 3.3–19.4)
Kappa, lambda light chain ratio: 1.11 (ref 0.26–1.65)
Lambda free light chains: 18.5 mg/L (ref 5.7–26.3)

## 2023-11-03 LAB — MAGNESIUM: Magnesium: 1.9 mg/dL (ref 1.7–2.4)

## 2023-11-03 MED ORDER — GUAIFENESIN-DM 100-10 MG/5ML PO SYRP: 5.0000 mL | ORAL_SOLUTION | ORAL | Status: DC | PRN

## 2023-11-03 MED ORDER — SODIUM CHLORIDE 1 G PO TABS
2.0000 g | ORAL_TABLET | Freq: Three times a day (TID) | ORAL | Status: DC
Start: 2023-11-03 — End: 2023-11-04
  Administered 2023-11-03 – 2023-11-04 (×3): 2 g via ORAL
  Filled 2023-11-03 (×3): qty 2

## 2023-11-03 MED ORDER — TOLVAPTAN 15 MG PO TABS
15.0000 mg | ORAL_TABLET | Freq: Once | ORAL | Status: AC
  Administered 2023-11-03: 15 mg via ORAL
  Filled 2023-11-03: qty 1

## 2023-11-03 NOTE — TOC Progression Note (Signed)
 Transition of Care [facility]) - Progression Note    Patient Details  Name: [name] MRN: [id number] Date of Birth: [birth year]  Transition of Care [facility]) CM/SW Contact  Truddie Hidden, RN Phone Number: 11/03/2023, 1:25 PM  Clinical Narrative:    Spoke with patient's son, Arlys John. Patient is from [facility].  Arlys John was advised patient will require an evaluation from therapy prior to her return to the facility.         Expected Discharge Plan and Services                                               Social Determinants of Health (SDOH) Interventions SDOH Screenings   Food Insecurity: No Food Insecurity (11/01/2023)  Housing: Low Risk  (11/01/2023)  Transportation Needs: No Transportation Needs (11/01/2023)  Utilities: Not At Risk (11/01/2023)  Financial Resource Strain: Low Risk  (01/15/2023)   Received from [facility]  Social Connections: Unknown (11/01/2023)  Tobacco Use: Low Risk  (10/31/2023)    Readmission Risk Interventions     No data to display

## 2023-11-03 NOTE — Consult Note (Signed)
 PHARMACY CONSULT NOTE - Tolvaptan  Pharmacy Consult for Tolvaptan Monitoring  Recent Labs: Potassium (mmol/L)  Date Value  11/03/2023 3.8   Magnesium (mg/dL)  Date Value  16/05/9603 1.9   Calcium (mg/dL)  Date Value  54/04/8118 7.7 (L)   Albumin (g/dL)  Date Value  14/78/2956 2.1 (L)   Phosphorus (mg/dL)  Date Value  21/30/8657 2.6   Sodium (mmol/L)  Date Value  11/03/2023 120 (L)    Assessment  [place] is a 83 y.o. female presenting with fall, hyponatremia, and pneumonia. PMH significant for dementia, hypertension, hyperlipidemia, chronic hyponatremia .  Patient found to have serum Na of 117 on admission improved to 120 today. Nephrology following. Pharmacy has been consulted to monitor tolvaptan.  Medication: Sodium chloride tablets Drug interactions: N/A  Goal of Therapy:  Na >130 Do not exceed increase in Na by 8 mEq/L per 8 hours or 12 mEq/L in 24 hours  Plan:  Give tolvaptan 15 mg x1 Check Na Q8H x 2 then daily thereafter Continue to monitor for signs of clinical improvement and recommendations per nephrology  Sabir Charters Rodriguez-Guzman PharmD, BCPS 11/03/2023 1:31 PM

## 2023-11-03 NOTE — [place] FL2 (Signed)
 [place] MEDICAID FL2 LEVEL OF CARE FORM     IDENTIFICATION  Patient Name: [place] Birthdate: 10-13-1940 Sex: female Admission Date (Current Location): 10/31/2023  [facility] and [place] Number:  Chiropodist and Address:  [facility], [address] [place], [place] [address]      Provider Number: [id number]  Attending Physician Name and Address:  Gillis Santa, MD  Relative Name and Phone Number:  Shadara, Lopez (Son)  [phone] [facility])    Current Level of Care: Hospital Recommended Level of Care: Skilled Nursing Facility Prior Approval Number:    Date Approved/Denied:   PASRR Number: [id number]  Discharge Plan: Home    Current Diagnoses: Patient Active Problem List   Diagnosis Date Noted   Hyponatremia 10/31/2023   Osteopenia 10/31/2023   Hypertension 10/31/2023   Hyperlipidemia 10/31/2023   Glaucoma 10/31/2023   Dementia without behavioral disturbance (HCC) 10/31/2023   SAH (subarachnoid hemorrhage) (HCC) 10/31/2023   CAP (community acquired pneumonia) 10/31/2023   Leukocytosis 10/31/2023   Hypokalemia 10/31/2023    Orientation RESPIRATION BLADDER Height & Weight     Self  Normal External catheter, Incontinent Weight: 45.8 kg Height:  5\' 2"  (157.5 cm)  BEHAVIORAL SYMPTOMS/MOOD NEUROLOGICAL BOWEL NUTRITION STATUS  Other (Comment) (n/a)  (n/a) Incontinent Diet  AMBULATORY STATUS COMMUNICATION OF NEEDS Skin   Limited Assist Verbally Normal                       Personal Care Assistance Level of Assistance  Bathing, Dressing Bathing Assistance: Limited assistance   Dressing Assistance: Limited assistance     Functional Limitations Info  Sight Sight Info: Adequate        SPECIAL CARE FACTORS FREQUENCY  PT (By licensed PT), OT (By licensed OT)     PT Frequency: Min 2x weekly OT Frequency: Min 2x weekly            Contractures Contractures Info: Not present    Additional Factors Info  Code  Status, Allergies Code Status Info: FULL Allergies Info: Codeine           Current Medications (11/03/2023):  This is the current hospital active medication list Current Facility-Administered Medications  Medication Dose Route Frequency Provider Last Rate Last Admin   acetaminophen (TYLENOL) tablet 650 mg  650 mg Oral Q6H PRN Verdene Lennert, MD   650 mg at 11/03/23 1050   Or   acetaminophen (TYLENOL) suppository 650 mg  650 mg Rectal Q6H PRN Verdene Lennert, MD       atorvastatin (LIPITOR) tablet 10 mg  10 mg Oral Daily Verdene Lennert, MD   10 mg at 11/03/23 0941   cefTRIAXone (ROCEPHIN) 2 g in sodium chloride 0.9 % 100 mL IVPB  2 g Intravenous Q24H Gillis Santa, MD 200 mL/hr at 11/03/23 1425 2 g at 11/03/23 1425   diltiazem (CARDIZEM CD) 24 hr capsule 240 mg  240 mg Oral Daily Verdene Lennert, MD   240 mg at 11/03/23 0942   feeding supplement (ENSURE ENLIVE / ENSURE PLUS) liquid 237 mL  237 mL Oral TID BM Gillis Santa, MD   237 mL at 11/03/23 1438   folic acid (FOLVITE) tablet 1 mg  1 mg Oral Daily Verdene Lennert, MD   1 mg at 11/03/23 0941   guaiFENesin-dextromethorphan (ROBITUSSIN DM) 100-10 MG/5ML syrup 5 mL  5 mL Oral Q4H PRN Gillis Santa, MD       haloperidol lactate (HALDOL) injection 1 mg  1 mg  Intravenous Q6H PRN Lindajo Royal V, MD   1 mg at 11/03/23 0504   latanoprost (XALATAN) 0.005 % ophthalmic solution 1 drop  1 drop Both Eyes QHS Verdene Lennert, MD   1 drop at 11/02/23 2203   lisinopril (ZESTRIL) tablet 5 mg  5 mg Oral Daily Verdene Lennert, MD   5 mg at 11/03/23 0941   loratadine (CLARITIN) tablet 10 mg  10 mg Oral Daily Gillis Santa, MD   10 mg at 11/03/23 0941   melatonin tablet 10 mg  10 mg Oral QHS Verdene Lennert, MD   10 mg at 11/02/23 2203   multivitamin with minerals tablet 1 tablet  1 tablet Oral Daily Verdene Lennert, MD   1 tablet at 11/03/23 0941   ondansetron (ZOFRAN) tablet 4 mg  4 mg Oral Q6H PRN Verdene Lennert, MD       Or   ondansetron (ZOFRAN)  injection 4 mg  4 mg Intravenous Q6H PRN Verdene Lennert, MD       polyethylene glycol (MIRALAX / GLYCOLAX) packet 17 g  17 g Oral Daily PRN Verdene Lennert, MD       sodium chloride flush (NS) 0.9 % injection 3 mL  3 mL Intravenous Q12H Verdene Lennert, MD   3 mL at 11/03/23 1914   sodium chloride tablet 2 g  2 g Oral TID WC Gillis Santa, MD   2 g at 11/03/23 1050   thiamine (VITAMIN B1) tablet 100 mg  100 mg Oral Daily Verdene Lennert, MD   100 mg at 11/03/23 0942   timolol (TIMOPTIC) 0.5 % ophthalmic solution 1 drop  1 drop Both Eyes BID Verdene Lennert, MD   1 drop at 11/03/23 7829     Discharge Medications: Please see discharge summary for a list of discharge medications.  Relevant Imaging Results:  Relevant Lab Results:   Additional Information SSN: [id number]  Truddie Hidden, RN

## 2023-11-03 NOTE — Evaluation (Addendum)
 Clinical/Bedside Swallow Evaluation Patient Details  Name: Cassie Taylor MRN: [id number] Date of Birth: [birth year]  Today's Date: 11/03/2023 Time: SLP Start Time (ACUTE ONLY): 1130 SLP Stop Time (ACUTE ONLY): 1220 SLP Time Calculation (min) (ACUTE ONLY): 50 min  Past Medical History:  Past Medical History:  Diagnosis Date   Benign head tremor    Hyperlipidemia    Hypertension    Motion sickness    cars   Scoliosis    lower back   Past Surgical History:  Past Surgical History:  Procedure Laterality Date   CATARACT EXTRACTION W/ INTRAOCULAR LENS IMPLANT Left 07/20/2012   [facility] - Dr Inez Pilgrim   CATARACT EXTRACTION W/PHACO Right 01/31/2020   Procedure: CATARACT EXTRACTION PHACO AND INTRAOCULAR LENS PLACEMENT (IOC) RIGHT 5.76 00:50.2 11.5%;  Surgeon: Lockie Mola, MD;  Location: [facility];  Service: Ophthalmology;  Laterality: Right;   COLONOSCOPY     FOOT SURGERY     HPI:  Pt is a 83 y.o. female with medical history significant of Dementia, resides at a Facility, hypertension, hyperlipidemia, chronic Hyponatremia, who presents to the ED due to ground-level fall.     History obtained by chart and patient's daughter-in-law at admit, given patient's underlying Dementia.  Per chart review, Patient had a ground-level fall earlier today that was unwitnessed.  The facility noted she had been also complaining of dizziness for the last few days.  This is after recent diagnosis of RSV and prolonged poor appetite afterwards.  RSV diagnosis was approximately 10 days ago.   Per Nephrology note: Differential seems to be "tea and toast" diet.   After her respiratory illness patient has not eating well.  She has been drinking mostly fluids.  Currently she is getting LR.  She is noted to have very low albumin of 2.1 suggesting malnutrition.  Noted Sodium at admit was 117; currently only 119-120.    CXR: Patchy bibasilar airspace opacities, right worse than left,  suspicious for multifocal  pneumonia.  Head CT: Small volume acute subarachnoid hemorrhage. Small hemorrhagic contusion in the anterior left temporal lobe. Chronic small vessel ischemic disease.     Assessment / Plan / Recommendation  Clinical Impression   Pt seen for BSE. Noted pt was drinking (sipping from straw) lying reclined in bed upon entering room. When asked if she thought this was "safe", she did not answer, but stated "my mouth is dry". Noted a bag full of tissues -- pt stated she had to "spit phlegm" at times -- she did not endorse Reflux or not.  Pt awake, restless in bed at times. She answered basic questions re: immediate self. Pt has a Baseline dx of Dementia.  On RA, afebrile.   Pt appears to present w/ functional oropharyngeal phase swallowing w/ No overt oropharyngeal phase dysphagia noted, No neuromuscular deficits noted. Pt consumed po trials w/ No overt, clinical s/s of aspiration during the po trials.  Pt appears at reduced risk for aspiration when following general aspiration precautions w/ easy to eat foods. However, pt does have challenging factors that could impact her oropharyngeal swallowing to include Baseline Dx of Dementia -- ANY Cognitive decline can impact awareness/timing of swallowing, recent fall w/ SAH, deconditioning/weakness, Hyponatremia(119-120 at this eval), and advanced age. These factors can increase risk for dysphagia/aspiration as well as decreased oral intake overall.   During po trials, pt consumed all consistencies w/ no overt coughing, decline in vocal quality, or change in respiratory presentation during/post trials. O2 sats 98% when checked. Oral phase  appeared [facility] w/ timely bolus management, mastication, and control of bolus propulsion for A-P transfer for swallowing. Oral clearing achieved w/ all trial consistencies -- moistened, soft foods given. Pt consumed ~6+ ozs of thin liquids via straw w/out difficulty. Overt s/s of potential REFLUX activity noted c/b Belching and c/o  Phlegm. She also c/o her throat being "dry". Noted she had a bag of menthol lozenges on her table -- mint and menthol can increase irritation of REFLUX.   OM Exam appeared [facility] w/ no unilateral weakness noted. Speech Clear. Pt fed self w/ setup support.   Recommend continue a fairly Regular/Mech Soft consistency diet w/ well-Cut meats, moistened foods; Thin liquids -- monitor straw use, and pt should Hold Cup when drinking. Recommend general aspiration precautions including Reducing distractions/talking at meals, eat/drink slowly using Small sips/bites. Sit FULLY UPRIGHT for all oral intake. Tray setup at meals. Pills CRUSHED vs WHOLE in Puree for safer, easier swallowing -- it was encouraged now and for D/C to the pt. REFLUX precautions.  Education given on Pills in Puree; food consistencies and easy to eat options; general aspiration and REFLUX precautions to pt and NSG. MD/NSG to reconsult if any new needs arise. MD/NSG updated, agreed. Recommend Dietician f/u for support; continued Palliative Care f/u for support/GOC and discussion on impact of Dementia on oral intake.  OF NOTE: Recommend f/u for cognitive-communication needs/assessment at pt's Facility IF determined that pt has had a decline from her previous functional status in her setting w/ her level of ADLs. This will best be determined in her setting post acuity of illness.   SLP Visit Diagnosis: Dysphagia, unspecified (R13.10)    Aspiration Risk   (reduced following general aspiration precautions)    Diet Recommendation   Thin;Age appropriate regular;Dysphagia 3 (mechanical soft) (cut/moistened foods for ease of eating) = a fairly Regular/Mech Soft consistency diet w/ well-Cut meats, moistened foods; Thin liquids -- monitor straw use, and pt should Hold Cup when drinking. Recommend general aspiration precautions including Reducing distractions/talking at meals, eat/drink slowly using Small sips/bites. Sit FULLY UPRIGHT for all oral intake.  Tray setup at meals. REFLUX precautions.   Medication Administration: Crushed with puree (vs Whole in Puree)    Other  Recommendations Recommended Consults: Consider GI evaluation (Dietician following; Palliative Care following) Oral Care Recommendations: Oral care BID;Oral care before and after PO;Staff/trained caregiver to provide oral care    Recommendations for follow up therapy are one component of a multi-disciplinary discharge planning process, led by the attending physician.  Recommendations may be updated based on patient status, additional functional criteria and insurance authorization.  Follow up Recommendations No SLP follow up      Assistance Recommended at Discharge  FULL at meals  Functional Status Assessment Patient has not had a recent decline in their functional status  Frequency and Duration  (n/a)   (n/a)       Prognosis Prognosis for improved oropharyngeal function: Fair (-Good) Barriers to Reach Goals: Cognitive deficits;Time post onset;Severity of deficits;Behavior;Motivation Barriers/Prognosis Comment: desire for drinks only - less desire for foods in her presentation; Dementia      Swallow Study   General Date of Onset: 10/31/23 HPI: Pt is a 83 y.o. female with medical history significant of Dementia, resides at a Facility, hypertension, hyperlipidemia, chronic Hyponatremia, who presents to the ED due to ground-level fall.     History obtained by chart and patient's daughter-in-law at admit, given patient's underlying Dementia.  Per chart review, Patient had a ground-level fall earlier  today that was unwitnessed.  The facility noted she had been also complaining of dizziness for the last few days.  This is after recent diagnosis of RSV and prolonged poor appetite afterwards.  RSV diagnosis was approximately 10 days ago.   Per Nephrology note: Differential seems to be "tea and toast" diet.   After her respiratory illness patient has not eating well.  She has been  drinking mostly fluids.  Currently she is getting LR.  She is noted to have very low albumin of 2.1 suggesting malnutrition.  Noted Sodium at admit was 117; currently only 119-120.   CXR: Patchy bibasilar airspace opacities, right worse than left,  suspicious for multifocal pneumonia.  Head CT: Small volume acute subarachnoid hemorrhage. Small hemorrhagic contusion in the anterior left temporal lobe. Chronic small vessel ischemic disease. Type of Study: Bedside Swallow Evaluation Previous Swallow Assessment: none Diet Prior to this Study: Regular;Thin liquids (Level 0) Temperature Spikes Noted: No (wbc 13.4) Respiratory Status: Room air History of Recent Intubation: No Behavior/Cognition: Alert;Cooperative;Pleasant mood;Confused;Distractible;Requires cueing (baseline Dementia) Oral Cavity Assessment: Within Functional Limits Oral Care Completed by SLP: Yes Oral Cavity - Dentition: Adequate natural dentition Vision: Functional for self-feeding Self-Feeding Abilities: Able to feed self;Needs assist;Needs set up Patient Positioning: Upright in bed (MAX assist) Baseline Vocal Quality: Normal Volitional Cough: Cognitively unable to elicit Volitional Swallow: Unable to elicit    Oral/Motor/Sensory Function Overall Oral Motor/Sensory Function: Within functional limits (no unilateral weakness noted)   Ice Chips Ice chips: Within functional limits Presentation: Spoon (fed; 1 trial)   Thin Liquid Thin Liquid: Within functional limits Presentation: Straw;Self Fed (~5-6 ozs total) Other Comments: water, juice, lemonade    Nectar Thick Nectar Thick Liquid: Not tested   Honey Thick Honey Thick Liquid: Not tested   Puree Puree: Within functional limits Presentation: Self Fed;Spoon (8+ trials)   Solid     Solid: Within functional limits Presentation: Self Fed;Spoon (fruit pieces: 8) Other Comments: melon, cantelope, 2 bites of sandwich then refused further         Jerilynn Som, MS,  CCC-SLP Speech Language Pathologist Rehab Services; [facility] [phone] (ascom) Amaziah Ghosh 11/03/2023,4:37 PM

## 2023-11-03 NOTE — Care Management Important Message (Signed)
 Important Message  Patient Details  Name: Cassie Taylor MRN: [id number] Date of Birth: Apr 09, 1941   Important Message Given:  Yes - Medicare IM     Marcell Anger 11/03/2023, 11:23 AM

## 2023-11-03 NOTE — Progress Notes (Signed)
 [facility]  ROUNDING NOTE   Subjective:   Patient seen sitting up in bed Daughter-in-law at bedside Patient continues to complain of dry mouth and thirst  Sodium 120  Objective:  Vital signs in last 24 hours:  Temp:  [98.1 F (36.7 C)-98.6 F (37 C)] 98.6 F (37 C) (04/02 1100) Pulse Rate:  [86-99] 99 (04/02 1100) Resp:  [16-18] 18 (04/02 1100) BP: (133-167)/(66-94) 167/94 (04/02 1100) SpO2:  [94 %-97 %] 97 % (04/02 1100)  Weight change:  Filed Weights   10/31/23 1050  Weight: 45.8 kg    Intake/Output: I/O last 3 completed shifts: In: 360 [P.O.:360] Out: -    Intake/Output this shift:  Total I/O In: 120 [P.O.:120] Out: -   Physical Exam: General: NAD  Head: Normocephalic, atraumatic. Moist oral mucosal membranes  Eyes: Anicteric  Lungs:  Clear to auscultation, normal effort  Heart: Regular rate and rhythm  Abdomen:  Soft, nontender, nondistended  Extremities: No peripheral edema.  Neurologic: Alert and oriented to self, moving all four extremities  Skin: No lesions       Basic Metabolic Panel: Recent Labs  Lab 10/31/23 1048 10/31/23 1850 11/01/23 0436 11/01/23 1628 11/02/23 0557 11/03/23 0357 11/03/23 1315  NA 117*   < > 120* 123* 124* 120* 119*  K 2.7*   < > 3.5 3.3* 3.3* 3.8 3.9  CL 75*   < > 82* 84* 85* 86* 86*  CO2 33*   < > 29 31 30 27 24   GLUCOSE 102*   < > 88 101* 107* 104* 146*  BUN 7*   < > 7* 9 5* 7* 6*  CREATININE 0.51   < > 0.40* 0.44 0.42* 0.33* 0.40*  CALCIUM 8.1*   < > 7.8* 7.6* 8.0* 7.7* 7.9*  MG 2.0  --  1.8  --  1.9 1.9  --   PHOS  --   --  2.3*  --  2.9 2.6  --    < > = values in this interval not displayed.    Liver Function Tests: Recent Labs  Lab 10/31/23 1048 11/01/23 0436 11/03/23 1315  AST 49* 40 45*  ALT 65* 54* 65*  ALKPHOS 74 61 70  BILITOT 0.8 0.6 0.6  PROT 6.4* 5.4* 5.9*  ALBUMIN 2.6* 2.1* 2.5*   No results for input(s): "LIPASE", "AMYLASE" in the last 168 hours. No results for  input(s): "AMMONIA" in the last 168 hours.  CBC: Recent Labs  Lab 10/31/23 1048 11/01/23 0436 11/02/23 0557 11/03/23 0357  WBC 15.9* 15.6* 11.7* 13.4*  NEUTROABS 13.1*  --   --   --   HGB 12.0 11.6* 12.6 11.6*  HCT 33.6* 32.1* 34.9* 32.1*  MCV 87.5 88.4 86.6 87.0  PLT 442* 441* 469* 434*    Cardiac Enzymes: No results for input(s): "CKTOTAL", "CKMB", "CKMBINDEX", "TROPONINI" in the last 168 hours.  BNP: Invalid input(s): "POCBNP"  CBG: No results for input(s): "GLUCAP" in the last 168 hours.  Microbiology: Results for orders placed or performed during the hospital encounter of 10/31/23  Resp panel by RT-PCR (RSV, Flu A&B, Covid) Anterior Nasal Swab     Status: None   Collection Time: 10/31/23 10:49 AM   Specimen: Anterior Nasal Swab  Result Value Ref Range Status   SARS Coronavirus 2 by RT PCR NEGATIVE NEGATIVE Final    Comment: (NOTE) SARS-CoV-2 target nucleic acids are NOT DETECTED.  The SARS-CoV-2 RNA is generally detectable in upper respiratory specimens during the acute phase of  infection. The lowest concentration of SARS-CoV-2 viral copies this assay can detect is 138 copies/mL. A negative result does not preclude SARS-Cov-2 infection and should not be used as the sole basis for treatment or other patient management decisions. A negative result may occur with  improper specimen collection/handling, submission of specimen other than nasopharyngeal swab, presence of viral mutation(s) within the areas targeted by this assay, and inadequate number of viral copies(<138 copies/mL). A negative result must be combined with clinical observations, patient history, and epidemiological information. The expected result is Negative.  Fact Sheet for Patients:  BloggerCourse.com  Fact Sheet for Healthcare Providers:  SeriousBroker.it  This test is no t yet approved or cleared by the [place] FDA and  has been  authorized for detection and/or diagnosis of SARS-CoV-2 by FDA under an Emergency Use Authorization (EUA). This EUA will remain  in effect (meaning this test can be used) for the duration of the COVID-19 declaration under Section 564(b)(1) of the Act, 21 U.S.C.section 360bbb-3(b)(1), unless the authorization is terminated  or revoked sooner.       Influenza A by PCR NEGATIVE NEGATIVE Final   Influenza B by PCR NEGATIVE NEGATIVE Final    Comment: (NOTE) The Xpert Xpress SARS-CoV-2/FLU/RSV plus assay is intended as an aid in the diagnosis of influenza from Nasopharyngeal swab specimens and should not be used as a sole basis for treatment. Nasal washings and aspirates are unacceptable for Xpert Xpress SARS-CoV-2/FLU/RSV testing.  Fact Sheet for Patients: BloggerCourse.com  Fact Sheet for Healthcare Providers: SeriousBroker.it  This test is not yet approved or cleared by the [place] FDA and has been authorized for detection and/or diagnosis of SARS-CoV-2 by FDA under an Emergency Use Authorization (EUA). This EUA will remain in effect (meaning this test can be used) for the duration of the COVID-19 declaration under Section 564(b)(1) of the Act, 21 U.S.C. section 360bbb-3(b)(1), unless the authorization is terminated or revoked.     Resp Syncytial Virus by PCR NEGATIVE NEGATIVE Final    Comment: (NOTE) Fact Sheet for Patients: BloggerCourse.com  Fact Sheet for Healthcare Providers: SeriousBroker.it  This test is not yet approved or cleared by the [place] FDA and has been authorized for detection and/or diagnosis of SARS-CoV-2 by FDA under an Emergency Use Authorization (EUA). This EUA will remain in effect (meaning this test can be used) for the duration of the COVID-19 declaration under Section 564(b)(1) of the Act, 21 U.S.C. section 360bbb-3(b)(1), unless the  authorization is terminated or revoked.  Performed at [facility], [address] [place], [place] [address]     Coagulation Studies: No results for input(s): "LABPROT", "INR" in the last 72 hours.  Urinalysis: No results for input(s): "COLORURINE", "LABSPEC", "PHURINE", "GLUCOSEU", "HGBUR", "BILIRUBINUR", "KETONESUR", "PROTEINUR", "UROBILINOGEN", "NITRITE", "LEUKOCYTESUR" in the last 72 hours.  Invalid input(s): "APPERANCEUR"     Imaging: No results found.   Medications:    cefTRIAXone (ROCEPHIN)  IV 2 g (11/03/23 1425)    atorvastatin  10 mg Oral Daily   diltiazem  240 mg Oral Daily   feeding supplement  237 mL Oral TID BM   folic acid  1 mg Oral Daily   latanoprost  1 drop Both Eyes QHS   lisinopril  5 mg Oral Daily   loratadine  10 mg Oral Daily   melatonin  10 mg Oral QHS   multivitamin with minerals  1 tablet Oral Daily   sodium chloride flush  3 mL Intravenous Q12H   sodium chloride  2 g Oral TID WC   thiamine  100 mg Oral Daily   timolol  1 drop Both Eyes BID   acetaminophen **OR** acetaminophen, guaiFENesin-dextromethorphan, haloperidol lactate, ondansetron **OR** ondansetron (ZOFRAN) IV, polyethylene glycol  Assessment/ Plan:  Ms. Cassie Taylor is a 83 y.o.  female  with medical problems of dementia, hypertension, hyperlipidemia.   Hyponatremia Differential seems to be tea and toast diet.  After her respiratory illness patient was not eating well.  She has been drinking mostly fluids. She is noted to have very low albumin of 2.1 suggesting malnutrition. Sodium decreased today to 120.  Patient complaining of dry mouth and thirst, likely drank more than restricted yesterday.  Will order tolvaptan 15 mg today.  Will resume 1200 mL fluid restriction tomorrow.  Patient encouraged to increase oral solute intake.  2.  Pneumonia Multifocal infiltrate by chest x-ray. Currently receiving broad-spectrum antibiotics-ceftriaxone and  azithromycin. Primary team will continue management.   3.  Fall CT head without contrast shows small volume acute subarachnoid hemorrhage.  Neurology recommends no interventions and will follow-up outpatient.   LOS: 3 Carel Carrier 4/2/20253:17 PM

## 2023-11-03 NOTE — Plan of Care (Signed)

## 2023-11-03 NOTE — Progress Notes (Addendum)
 Palliative Care Progress Note, Assessment & Plan   Patient Name: Cassie Taylor       Date: 11/03/2023 DOB: 04-28-1941  Age: 83 y.o. MRN#: [id number] Attending Physician: Gillis Santa, MD Primary Care Physician: Nonda Lou, MD Admit Date: 10/31/2023  Subjective: Patient is sitting up in bed in no apparent distress.  She acknowledges my presence.  She is alert to self.  She is pleasantly confused but appropriate.  Nurse tech is at bedside checking her vital signs.  No family or friends present during my visit.  HPI: 83 y.o. female  with past medical history of dementia, hypertension, hyperlipidemia, chronic hyponatremia admitted on 10/31/2023 with ground-level fall.   Upon arrival to ED, CT of the head was notable for small volume acute subarachnoid hemorrhage with small hemorrhagic contusion in the anterior left temporal lobe.  Neurosurgery was consulted and recommended CTA.  CTA was negative for any large vessel occlusions and no aneurysms present.   Patient is being treated for hyponatremia.  Nephrology was consulted.   PMT was consulted to support patient and family with goals of care discussions.   Summary of counseling/coordination of care: Extensive chart review completed prior to meeting patient including labs, vital signs, imaging, progress notes, orders, and available advanced directive documents from current and previous encounters.   After reviewing the patient's chart and assessing the patient at bedside, I spoke with patient in regards to symptom management and goals of care.   Symptoms assessed.  Patient endorses mild pain in her throat where she feels like she did not completely swallow a pill.  Sip of water given and chin tuck swallow encouraged.  She endorses this helped  somewhat.  She denies chest pain or radiating discomfort.  After reviewing MAR, suggested that patient utilize Tylenol.  1 dose of Tylenol given.  I attempted to gauge patient's understanding of her current medical situation.  She says she is not sure.  Patient remains unable to participate in goals of care or medical decision making independently at this time.  After visiting with the patient, I spoke with her son Arlys John over the phone.  Alycia Rossetti is patient's HCPOA and he has provided this paperwork.  Electronic copy sent to medical records for download to chart.  Additionally, Arlys John provided patient's latest advance directive.  Copy of advance directive sent to medical records for electronic download to chart as well.  Discussed advanced care planning and CODE STATUS with Arlys John.  Arlys John shares that he believes a DNR with limited interventions is in alignment with patient's outlined wishes.  Discussed that DNR with limited interventions means that in the event of a cardiopulmonary arrest that the medical team would allow a natural passing.  Additionally, efforts will be made to treat the treatable and all treatments that are appropriate and available would be given up into the point of artificially sustaining patient's life with ventilator/machines.  Arlys John shares that he believes this is appropriate and within the patient's goals and wishes when she was of sound mind.  CODE STATUS changed to DNR with limited interventions.  Attending and RN made aware of CODE STATUS change.  I attempted to elicit values and goals important to [place].  He shares that he would like for his mother to be able to return to [facility] if feasible.  He has spoken with [facility] case manager Kiana in regards to [facility] necessitating an evaluation prior to patient being allowed to return to [facility].  TOC following closely.  Additionally, we discussed patient's chronic hyponatremia.  Discussed that artificial intervention such as  IV fluids are not sustainable outside of the hospital although they may improve her numbers.  Arlys John was in agreement that we will continue to pursue increased p.o. intake to see if this helps with patient's hyponatremia.  Time for outcomes.  In light of patient likely returning to another medical facility, introduced the concept of a MOST form.  Copy of MOST form emailed to Paxton for his review.  He would like to look it over and we agreed to discuss it further tomorrow.  PMT will continue to follow and support patient and family throughout her hospitalization.  Physical Exam Vitals reviewed.  Constitutional:      General: She is not in acute distress.    Appearance: She is normal weight. She is not toxic-appearing.  HENT:     Head: Normocephalic.     Mouth/Throat:     Mouth: Mucous membranes are moist.  Eyes:     Pupils: Pupils are equal, round, and reactive to light.  Pulmonary:     Effort: Pulmonary effort is normal.  Abdominal:     Palpations: Abdomen is soft.  Skin:    General: Skin is warm and dry.  Neurological:     Mental Status: She is alert.     Comments: Oriented to self  Psychiatric:        Mood and Affect: Mood normal.        Behavior: Behavior normal.             Total Time 50 minutes   Time spent includes: Detailed review of medical records (labs, imaging, vital signs), medically appropriate exam (mental status, respiratory, cardiac, skin), discussed with treatment team, counseling and educating patient, family and staff, documenting clinical information, medication management and coordination of care.  Samara Deist L. Bonita Quin, DNP, FNP-BC Palliative Medicine Team

## 2023-11-03 NOTE — Progress Notes (Signed)
 PT Cancellation Note  Patient Details Name: Cassie Taylor MRN: [id number] DOB: Mar 26, 1941   Cancelled Treatment:    Reason Eval/Treat Not Completed:  (Per chart review, pt having agitation issues overnight, frequent incontinence. Na+ this AM outside of appropriate level for PT, decreased from previous day.) Will continue to follow and evaluate at later date/time as appropriate.   8:21 AM, 11/03/23 Rosamaria Lints, PT, DPT Physical Therapist - [facility]  [phone] (ASCOM)    Breton Berns C 11/03/2023, 8:20 AM

## 2023-11-03 NOTE — Progress Notes (Signed)
 [facility] Progress Note  Patient: Cassie Taylor    WUJ:[id number]  DOA: 10/31/2023     Date of Service: the patient was seen and examined on 11/03/2023  Chief Complaint  Patient presents with   Fall        Brief hospital course:  ODESTER NILSON is a 83 y.o. female with medical history significant of dementia, hypertension, hyperlipidemia, chronic hyponatremia, who presents to the ED due to ground-level fall.   History obtained through chart review and from patient's daughter-in-law at bedside, given patient's underlying dementia.  Per chart review, Patient had a ground-level fall earlier today that was unwitnessed.  Facility staff were not sure if she hit her head, however she complained of her head hurting.  The facility noted she had been also complaining of dizziness for the last few days.  This is after recent diagnosis of RSV and prolonged poor appetite afterwards.  RSV diagnosis was approximately 10 days ago.   At this time, Mrs. Gilland denies any pain including chest pain, abdominal pain.  She denies any shortness of breath, cough, nausea, vomiting, diarrhea.  She denies any dizziness, headache or focal weakness.   ED course: On arrival to the ED, patient was normotensive at 134/67 with heart rate of 72.  She was saturating at 93% on room air.  She was afebrile at 97.9.  Initial workup notable for WBC of 15.9, platelets 442, sodium 117, potassium 2.7, BUN 7, creatinine 0.51, bicarb 33, AST 49, ALT 65, GFR above 60.  Troponin negative.  COVID-19, influenza and RSV PCR negative.  Urinalysis negative.  CT of the head notable for small volume acute subarachnoid hemorrhage with small hemorrhagic contusion in the anterior left temporal lobe.  Neurosurgery consulted, recommended CTA, and if negative, no further recommendations.  CTA ordered and pending.  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 Serum osmolality 247 low S/p NS bolus in the ED, patient was on LR which has been discontinued. Continue fluid restriction 1.5 L/day Na 117--120--124--119 Continue to trend sodium level Sodium correction range 8-10 mmol in 24 hours Nephrology consult appreciated  # Hypokalemia, potassium repleted. # Hypophosphatemia, Phos repleted. Monitor electrolytes and replete as needed.  # Community-acquired pneumonia CXR: Patchy bibasilar airspace opacities, right worse than left, suspicious for multifocal pneumonia. Continue ceftriaxone and azithromycin 3 days Procalcitonin negative Trend WBC count Started Mucinex and Claritin for symptomatic management  # Subarachnoid hemorrhage s/p ground-level fall CT head positive for subarachnoid hemorrhage CTA head negative for any large vessel occlusion, no aneurysms. 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  Interventions:  Diet: Regular diet, fluid striction 1.5 L/day DVT Prophylaxis: Therapeutic Anticoagulation with subarachnoid hemorrhage    Advance goals of care discussion: DNR-limited  Family Communication: family was present at bedside, at the time of interview.  The pt provided permission to discuss medical plan with the family. Opportunity was given to ask question and all questions were answered satisfactorily.   Disposition:  Pt is from ALF, admitted with fall, SAH and  Hyponatremia, still has low Na, which precludes a safe discharge. Discharge to ALF vs SNF TBD , when stable,  may need few days to improve.  Subjective: No significant events overnight, patient still clearing her throat due to secretions, feels chest congestion and complaining of difficulty swallowing. She was lying comfortably but she was feeling that she cannot breathe and feeling sensation of choking. SLP eval done, recommended no oropharyngeal dysphagia may have esophageal component.   Physical Exam: General: NAD, lying comfortably Appear in no distress, affect appropriate Eyes: PERRLA ENT: Oral Mucosa Clear, moist  Neck: no JVD,  Cardiovascular: S1 and S2 Present, no Murmur,  Respiratory: good respiratory effort, Bilateral Air entry equal and Decreased, no Crackles, no wheezes Abdomen: Bowel Sound present, Soft and no tenderness,  Skin: no rashes Extremities: no Pedal edema, no calf tenderness Neurologic: without any new focal findings Gait not checked due to patient safety concerns  Vitals:   11/03/23 0110 11/03/23 0320 11/03/23 0741 11/03/23 1100  BP: (!) 153/87 (!) 153/88 (!) 161/86 (!) 167/94  Pulse: 87 86 90 99  Resp: 16 16 18 18   Temp: 98.1 F (36.7 C) 98.3 F (36.8 C) 98.6 F (37 C) 98.6 F (37 C)  TempSrc:      SpO2: 96% 95% 94% 97%  Weight:      Height:        Intake/Output Summary (Last 24 hours) at 11/03/2023 1450 Last data filed at 11/03/2023 1033 Gross per 24 hour  Intake 240 ml  Output --  Net 240 ml    Filed Weights   10/31/23 1050  Weight: 45.8 kg    Data Reviewed: I have personally reviewed and interpreted daily labs, tele strips, imagings as discussed above. I reviewed all nursing notes, pharmacy notes, vitals, pertinent old records I have discussed plan of care as described above with RN and patient/family.  CBC: Recent Labs  Lab 10/31/23 1048 11/01/23 0436 11/02/23 0557 11/03/23 0357  WBC 15.9* 15.6* 11.7* 13.4*  NEUTROABS 13.1*  --   --   --   HGB 12.0 11.6* 12.6 11.6*  HCT 33.6* 32.1* 34.9* 32.1*  MCV 87.5 88.4 86.6 87.0  PLT 442* 441*  469* 434*   Basic Metabolic Panel: Recent Labs  Lab 10/31/23 1048 10/31/23 1850 11/01/23 0436 11/01/23 1628 11/02/23 0557 11/03/23 0357 11/03/23 1315  NA 117*   < > 120* 123* 124* 120* 119*  K 2.7*   < > 3.5 3.3* 3.3* 3.8 3.9  CL 75*   < > 82* 84* 85* 86* 86*  CO2 33*   < > 29 31 30 27 24   GLUCOSE 102*   < > 88 101* 107* 104* 146*  BUN 7*   < > 7* 9 5* 7* 6*  CREATININE 0.51   < > 0.40* 0.44 0.42* 0.33* 0.40*  CALCIUM 8.1*   < > 7.8* 7.6* 8.0* 7.7* 7.9*  MG 2.0  --  1.8  --  1.9 1.9  --   PHOS  --   --  2.3*  --  2.9 2.6  --    < > = values in this interval not displayed.    Studies: No results found.   Scheduled Meds:  atorvastatin  10 mg Oral Daily   diltiazem  240 mg Oral Daily   feeding supplement  237 mL Oral TID BM   folic acid  1 mg Oral Daily   guaiFENesin  600 mg Oral BID   latanoprost  1 drop Both  Eyes QHS   lisinopril  5 mg Oral Daily   loratadine  10 mg Oral Daily   melatonin  10 mg Oral QHS   multivitamin with minerals  1 tablet Oral Daily   sodium chloride flush  3 mL Intravenous Q12H   sodium chloride  2 g Oral TID WC   thiamine  100 mg Oral Daily   timolol  1 drop Both Eyes BID   Continuous Infusions:  cefTRIAXone (ROCEPHIN)  IV 2 g (11/03/23 1425)   PRN Meds: acetaminophen **OR** acetaminophen, haloperidol lactate, ondansetron **OR** ondansetron (ZOFRAN) IV, polyethylene glycol  Time spent: 40 minutes  Author: Gillis Santa. MD [facility] 11/03/2023 2:50 PM  To reach On-call, see care teams to locate the attending and reach out to them via [email or web address]. If 7PM-7AM, please contact night-coverage If you still have difficulty reaching the attending provider, please page the [facility] (Director on Call) for [facility] on amion for assistance.

## 2023-11-03 NOTE — Plan of Care (Signed)
  Problem: Clinical Measurements: Goal: Ability to maintain clinical measurements within normal limits will improve Outcome: Progressing   Problem: Clinical Measurements: Goal: Diagnostic test results will improve Outcome: Progressing   Problem: Activity: Goal: Risk for activity intolerance will decrease Outcome: Progressing   Problem: Skin Integrity: Goal: Risk for impaired skin integrity will decrease Outcome: Progressing

## 2023-11-03 NOTE — Progress Notes (Addendum)
 Patient continues to have urinary frequency and loose liquid stools throughout the evening. Encouraged patient to try and double void or sit on the toilet for a few minutes. Patient remains anxious and agitated, Haldol PRN given as ordered. Haldol was not effective continue to decrease stimulation, low lights and TV off.

## 2023-11-04 ENCOUNTER — Inpatient Hospital Stay

## 2023-11-04 DIAGNOSIS — I609 Nontraumatic subarachnoid hemorrhage, unspecified: Secondary | ICD-10-CM | POA: Diagnosis not present

## 2023-11-04 DIAGNOSIS — Z515 Encounter for palliative care: Secondary | ICD-10-CM | POA: Diagnosis not present

## 2023-11-04 DIAGNOSIS — F0390 Unspecified dementia without behavioral disturbance: Secondary | ICD-10-CM | POA: Diagnosis not present

## 2023-11-04 DIAGNOSIS — E871 Hypo-osmolality and hyponatremia: Secondary | ICD-10-CM | POA: Diagnosis not present

## 2023-11-04 LAB — BASIC METABOLIC PANEL WITH GFR
Anion gap: 10 (ref 5–15)
BUN: 9 mg/dL (ref 8–23)
CO2: 28 mmol/L (ref 22–32)
Calcium: 8.6 mg/dL — ABNORMAL LOW (ref 8.9–10.3)
Chloride: 93 mmol/L — ABNORMAL LOW (ref 98–111)
Creatinine, Ser: 0.42 mg/dL — ABNORMAL LOW (ref 0.44–1.00)
GFR, Estimated: >60 mL/min (ref >60)
Glucose, Bld: 119 mg/dL — ABNORMAL HIGH (ref 70–99)
Potassium: 3.7 mmol/L (ref 3.5–5.1)
Sodium: 130 mmol/L — ABNORMAL LOW (ref 135–145)

## 2023-11-04 LAB — CBC
HCT: 35.7 % — ABNORMAL LOW (ref 36.0–46.0)
Hemoglobin: 12.6 g/dL (ref 12.0–15.0)
MCH: 31 pg (ref 26.0–34.0)
MCHC: 35.3 g/dL (ref 30.0–36.0)
MCV: 87.9 fL (ref 80.0–100.0)
Platelets: 528 10*3/uL — ABNORMAL HIGH (ref 150–400)
RBC: 4.06 MIL/uL (ref 3.87–5.11)
RDW: 12.3 % (ref 11.5–15.5)
WBC: 13.3 10*3/uL — ABNORMAL HIGH (ref 4.0–10.5)
nRBC: 0 % (ref 0.0–0.2)

## 2023-11-04 LAB — MAGNESIUM: Magnesium: 2.1 mg/dL (ref 1.7–2.4)

## 2023-11-04 LAB — PHOSPHORUS: Phosphorus: 3.2 mg/dL (ref 2.5–4.6)

## 2023-11-04 MED ORDER — QUETIAPINE FUMARATE 25 MG PO TABS
25.0000 mg | ORAL_TABLET | ORAL | Status: DC
  Administered 2023-11-04 – 2023-11-05 (×2): 25 mg via ORAL
  Filled 2023-11-04 (×2): qty 1

## 2023-11-04 MED ORDER — SODIUM CHLORIDE 1 G PO TABS
1.0000 g | ORAL_TABLET | Freq: Three times a day (TID) | ORAL | Status: DC
  Administered 2023-11-04 – 2023-11-05 (×4): 1 g via ORAL
  Filled 2023-11-04 (×4): qty 1

## 2023-11-04 NOTE — Progress Notes (Signed)
 [facility] Progress Note  Patient: Cassie Taylor    ZOX:[id number]  DOA: 10/31/2023     Date of Service: the patient was seen and examined on 11/04/2023  Chief Complaint  Patient presents with   Fall        Brief hospital course:  [name] is a 83 y.o. female with medical history significant of dementia, hypertension, hyperlipidemia, chronic hyponatremia, who presents to the ED due to ground-level fall.   History obtained through chart review and from patient's daughter-in-law at bedside, given patient's underlying dementia.  Per chart review, Patient had a ground-level fall earlier today that was unwitnessed.  Facility staff were not sure if she hit her head, however she complained of her head hurting.  The facility noted she had been also complaining of dizziness for the last few days.  This is after recent diagnosis of RSV and prolonged poor appetite afterwards.  RSV diagnosis was approximately 10 days ago.   At this time, [name] denies any pain including chest pain, abdominal pain.  She denies any shortness of breath, cough, nausea, vomiting, diarrhea.  She denies any dizziness, headache or focal weakness.   ED course: On arrival to the ED, patient was normotensive at 134/67 with heart rate of 72.  She was saturating at 93% on room air.  She was afebrile at 97.9.  Initial workup notable for WBC of 15.9, platelets 442, sodium 117, potassium 2.7, BUN 7, creatinine 0.51, bicarb 33, AST 49, ALT 65, GFR above 60.  Troponin negative.  COVID-19, influenza and RSV PCR negative.  Urinalysis negative.  CT of the head notable for small volume acute subarachnoid hemorrhage with small hemorrhagic contusion in the anterior left temporal lobe.  Neurosurgery consulted, recommended CTA, and if negative, no further recommendations.  CTA ordered and pending.  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 Serum osmolality 247 low S/p NS bolus in the ED, patient was on LR which has been discontinued. Continue fluid restriction 1.5 L/day Na 117--120--124--119---130 Continue to trend sodium level Sodium correction range 8-10 mmol in 24 hours S/p Salt Tab 2 g po TID on 4/2 and decreased 1 g po tid on 4/3 Nephrology consult appreciated S/p tolvaptan 15 mg one-time dose given on 4/2   # Hypokalemia, potassium repleted.  Resolved # Hypophosphatemia, Phos repleted.  Resolved Monitor electrolytes and replete as needed.  # Community-acquired pneumonia CXR: Patchy bibasilar airspace opacities, right worse than left, suspicious for multifocal pneumonia. S/p ceftriaxone x 5 days and azithromycin 3 days Procalcitonin negative Trend WBC count S/p Mucinex 600 BID, started Robitussin DM as needed and  Claritin for symptomatic management  # Subarachnoid hemorrhage s/p ground-level fall CT head positive for subarachnoid hemorrhage CTA head negative for any large vessel occlusion, no aneurysms. Seen by neurosurgery, recommended no intervention if no abnormality on CT angiogram Patient may follow-up with neurosurgery as an outpatient   # Hypertension Continue Cardizem and lisinopril Held HCTZ due to hyponatremia Monitor BP and titrate medications accordingly   Esophageal dysmotility causing dysphagia,  SLP eval done, recommended continue current diet no oropharyngeal dysphagia Barium swallow study negative for esophageal stricture, shows esophageal dysmotility.    Body mass index is 18.47 kg/m.  Interventions:  Diet: Regular diet, fluid striction 1.5 L/day DVT Prophylaxis: Therapeutic Anticoagulation with subarachnoid hemorrhage    Advance goals of care discussion: DNR-limited  Family Communication:  family was present at bedside, at the time of interview.  The pt provided permission to discuss medical plan with the family. Opportunity was given to  ask question and all questions were answered satisfactorily.   Disposition:  Pt is from ALF, admitted with fall, SAH and Hyponatremia, still has low Na, which precludes a safe discharge. Discharge to ALF vs SNF TBD , when stable, may need few days to improve.  Subjective: No significant events overnight, patient denied any headache or dizziness.  Feeling physically little bit stronger and working with physical therapy, she walked in the hallway with walker.  Physical Exam: General: NAD, lying comfortably Appear in no distress, affect appropriate Eyes: PERRLA ENT: Oral Mucosa Clear, moist  Neck: no JVD,  Cardiovascular: S1 and S2 Present, no Murmur,  Respiratory: good respiratory effort, Bilateral Air entry equal and Decreased, no Crackles, no wheezes Abdomen: Bowel Sound present, Soft and no tenderness,  Skin: no rashes Extremities: no Pedal edema, no calf tenderness Neurologic: without any new focal findings Gait not checked due to patient safety concerns  Vitals:   11/04/23 0605 11/04/23 0740 11/04/23 0741 11/04/23 1040  BP: (!) 157/92 (!) 144/83 (!) 144/83 (!) 150/88  Pulse: 94 84 79 88  Resp: 20 (!) 23  (!) (P) 21  Temp: 98 F (36.7 C) 97.8 F (36.6 C) 97.8 F (36.6 C) 97.6 F (36.4 C)  TempSrc: Oral Oral Oral Oral  SpO2: 94% 96% 96% 96%  Weight:      Height:        Intake/Output Summary (Last 24 hours) at 11/04/2023 1428 Last data filed at 11/04/2023 1300 Gross per 24 hour  Intake 920 ml  Output --  Net 920 ml    Filed Weights   10/31/23 1050  Weight: 45.8 kg    Data Reviewed: I have personally reviewed and interpreted daily labs, tele strips, imagings as discussed above. I reviewed all nursing notes, pharmacy notes, vitals, pertinent old records I have discussed plan of care as described above with RN and patient/family.  CBC: Recent Labs  Lab 10/31/23 1048 11/01/23 0436 11/02/23 0557 11/03/23 0357 11/04/23 0429  WBC 15.9* 15.6* 11.7* 13.4* 13.3*   NEUTROABS 13.1*  --   --   --   --   HGB 12.0 11.6* 12.6 11.6* 12.6  HCT 33.6* 32.1* 34.9* 32.1* 35.7*  MCV 87.5 88.4 86.6 87.0 87.9  PLT 442* 441* 469* 434* 528*   Basic Metabolic Panel: Recent Labs  Lab 10/31/23 1048 10/31/23 1850 11/01/23 0436 11/01/23 1628 11/02/23 0557 11/03/23 0357 11/03/23 1315 11/03/23 2116 11/04/23 0429  NA 117*   < > 120* 123* 124* 120* 119* 124* 130*  K 2.7*   < > 3.5 3.3* 3.3* 3.8 3.9  --  3.7  CL 75*   < > 82* 84* 85* 86* 86*  --  93*  CO2 33*   < > 29 31 30 27 24   --  28  GLUCOSE 102*   < > 88 101* 107* 104* 146*  --  119*  BUN 7*   < > 7* 9 5* 7* 6*  --  9  CREATININE 0.51   < > 0.40* 0.44 0.42* 0.33* 0.40*  --  0.42*  CALCIUM 8.1*   < > 7.8* 7.6* 8.0* 7.7* 7.9*  --  8.6*  MG 2.0  --  1.8  --  1.9 1.9  --   --  2.1  PHOS  --   --  2.3*  --  2.9 2.6  --   --  3.2   < > = values in this interval not displayed.    Studies: DG ESOPHAGUS W SINGLE CM (SOL OR THIN BA) Result Date: 11/04/2023 CLINICAL DATA:  Patient with complaints of dysphagia and increased oral secretions. EXAM: ESOPHAGUS/BARIUM SWALLOW/TABLET STUDY TECHNIQUE: Single contrast examination was performed using thin barium liquid. The patient was observed with fluoroscopy swallowing a 13 mm barium sulphate tablet. FLUOROSCOPY TIME:  Radiation Exposure Index (as provided by the fluoroscopic device): 2 minute 36 seconds, 14.9 mGy COMPARISON:  Chest radiograph dated 10/31/2023 FINDINGS: Normal pharyngeal anatomy and motility. No frank tracheal aspiration. Contrast flowed freely through the esophagus without evidence of a stricture or mass. Transient focal outpouching of the posterolateral hypopharynx on LPO positioning. Normal esophageal mucosa without evidence of irregularity or ulceration. Moderate esophageal dysmotility is seen with proximal contrast bolus escape and associated tertiary contractions. Gastroesophageal reflux not fully evaluated secondary to dysmotility and limited exam. No  definite hiatal hernia was demonstrated. A 13 mm barium tablet was administered which transited through the esophagus and esophagogastric junction without delay. IMPRESSION: 1. Limited esophagram secondary to patient's inability to stand or tolerate full exam. 2. Normal pharyngeal anatomy and motility without tracheal aspiration. Transient focal outpouching of the posterolateral hypopharynx on LPO positioning may reflect superimposition of the normal pyriformis sinus rather than diverticulum. 3. Esophageal dysmotility with proximal contrast bolus escape and associated tertiary contractions. 4. No evidence of esophageal stricture with successful passage of 13 mm barium tablet. This exam was performed by Pattricia Boss PA-C, and was supervised and interpreted by Dr. Roda Shutters. Electronically Signed   By: Agustin Cree M.D.   On: 11/04/2023 11:34     Scheduled Meds:  atorvastatin  10 mg Oral Daily   diltiazem  240 mg Oral Daily   feeding supplement  237 mL Oral TID BM   folic acid  1 mg Oral Daily   latanoprost  1 drop Both Eyes QHS   lisinopril  5 mg Oral Daily   loratadine  10 mg Oral Daily   multivitamin with minerals  1 tablet Oral Daily   QUEtiapine  25 mg Oral PC supper   sodium chloride flush  3 mL Intravenous Q12H   sodium chloride  1 g Oral TID WC   thiamine  100 mg Oral Daily   timolol  1 drop Both Eyes BID   Continuous Infusions:   PRN Meds: acetaminophen **OR** acetaminophen, guaiFENesin-dextromethorphan, ondansetron **OR** ondansetron (ZOFRAN) IV, polyethylene glycol  Time spent: 40 minutes  Author: Gillis Santa. MD [facility] 11/04/2023 2:28 PM  To reach On-call, see care teams to locate the attending and reach out to them via [email or web address]. If 7PM-7AM, please contact night-coverage If you still have difficulty reaching the attending provider, please page the [facility] (Director on Call) for [facility] on amion for assistance.

## 2023-11-04 NOTE — Progress Notes (Signed)
 Pt is confused, restless and keeps on getting out of bed to look for her husband. Reoriented to person, time and place. Pt is not redirectable. PRN Haldol 1 mg IV given.

## 2023-11-04 NOTE — Plan of Care (Signed)
   Problem: Education: Goal: Knowledge of General Education information will improve Description Including pain rating scale, medication(s)/side effects and non-pharmacologic comfort measures Outcome: Progressing   Problem: Clinical Measurements: Goal: Will remain free from infection Outcome: Progressing  Goal: Diagnostic test results will improve Outcome: Progressing Goal: Respiratory complications will improve Outcome: Progressing Goal: Cardiovascular complication will be avoided Outcome: Progressing   Problem: Activity: Goal: Risk for activity intolerance will decrease Outcome: Progressing

## 2023-11-04 NOTE — Consult Note (Signed)
 PHARMACY CONSULT NOTE - Tolvaptan  Pharmacy Consult for Tolvaptan Monitoring  Recent Labs: Potassium (mmol/L)  Date Value  11/04/2023 3.7   Magnesium (mg/dL)  Date Value  16/05/9603 2.1   Calcium (mg/dL)  Date Value  54/04/8118 8.6 (L)   Albumin (g/dL)  Date Value  14/78/2956 2.5 (L)   Phosphorus (mg/dL)  Date Value  21/30/8657 3.2   Sodium (mmol/L)  Date Value  11/04/2023 130 (L)    Assessment  Cassie Taylor is a 83 y.o. female presenting with fall, hyponatremia, and pneumonia. PMH significant for dementia, hypertension, hyperlipidemia, chronic hyponatremia .  Patient found to have serum Na of 117 on admission improved to 120 today. Nephrology following. Pharmacy has been consulted to monitor tolvaptan.  Medication: Sodium chloride tablets Drug interactions: N/A  Goal of Therapy:  Na >130 Do not exceed increase in Na by 8 mEq/L per 8 hours or 12 mEq/L in 24 hours  Plan:  Give tolvaptan 15 mg x 1 at 1425, Na 120>119>124>130 (appropriate increase in 24 hours).  Will continue to follow AM labs peripherally and dc consult after 24 hours. Continue to monitor for signs of clinical improvement and recommendations per nephrology.  Cassie Taylor PharmD, BCPS 11/04/2023 7:32 AM

## 2023-11-04 NOTE — Evaluation (Signed)
 Physical Therapy Evaluation Patient Details Name: Cassie Taylor MRN: [id number] DOB: [birth year] Today's Date: 11/04/2023  History of Present Illness  Cassie Taylor is an 83yoF who comes to [facility] ED on 3/30 from [facility] after fall. Pt reports dizziness x 2 days. Na+: 117. Imaging shows small SAH, after 2nd image no further neurosurgery recommendations at this time.  Clinical Impression  Pt is awake, agreeable to PT evaluation, instinctively motivated to get OOB. Pt able to perform all mobility without any physical assist, however close close supervision in provided as Cassie Taylor is unfamiliar with patient and dementia precludes much reporting on PLOF. Pt reports some weakness in bed mobility and transfers, but appears more close to baseline with straight plane AMB. Pt likely would do well with return to facility and PT services in place. Will continue to follow.       If plan is discharge home, recommend the following: A little help with walking and/or transfers;A little help with bathing/dressing/bathroom;Assistance with cooking/housework;Assist for transportation;Supervision due to cognitive status;Direct supervision/assist for medications management;Direct supervision/assist for financial management   Can travel by private vehicle        Equipment Recommendations None recommended by PT  Recommendations for Other Services       Functional Status Assessment Patient has had a recent decline in their functional status and demonstrates the ability to make significant improvements in function in a reasonable and predictable amount of time.     Precautions / Restrictions Precautions Precautions: Fall Restrictions Weight Bearing Restrictions Per Provider Order: No      Mobility  Bed Mobility Overal bed mobility: Needs Assistance Bed Mobility: Supine to Sit     Supine to sit: Supervision     General bed mobility comments: moving fairly well today    Transfers Overall transfer  level: Needs assistance Equipment used: Rolling walker (2 wheels) Transfers: Sit to/from Stand Sit to Stand: Modified independent (Device/Increase time)           General transfer comment: from EOB, from recliner, from toilet    Ambulation/Gait Ambulation/Gait assistance: Contact guard assist, Supervision Gait Distance (Feet): 80 Feet Assistive device: Rolling walker (2 wheels) Gait Pattern/deviations: Step-through pattern       General Gait Details: slow on the turn, otherwise well controlled  Stairs            Wheelchair Mobility     Tilt Bed    Modified Rankin (Stroke Patients Only)       Balance                                             Pertinent Vitals/Pain Pain Assessment Pain Assessment: No/denies pain    Home Living                   Home Equipment: Rollator (4 wheels) Additional Comments: per granddaughter likely handicapped accessible bathroom as pt and her spouse live together at ALF and he has more physical needs    Prior Function Prior Level of Function : Independent/Modified Independent;History of Falls (last six months)             Mobility Comments: IND prior to a month ago, but recently began using rollator 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; 1 fall which led to this hospitalization; walks the halls of ALF and participates in Zumba ADLs Comments: MOD I/IND with ADLs  Extremity/Trunk Assessment                Communication        Cognition Arousal: Alert Behavior During Therapy: WFL for tasks assessed/performed   PT - Cognitive impairments: No apparent impairments, History of cognitive impairments                                 Cueing       General Comments      Exercises     Assessment/Plan    PT Assessment Patient needs continued PT services  PT Problem List Decreased strength;Decreased activity tolerance;Decreased balance;Decreased mobility       PT Treatment  Interventions Functional mobility training;Therapeutic activities;Therapeutic exercise;Patient/family education;Cognitive remediation    PT Goals (Current goals can be found in the Care Plan section)  Acute Rehab PT Goals PT Goal Formulation: Patient unable to participate in goal setting    Frequency Min 2X/week     Co-evaluation               AM-PAC PT "6 Clicks" Mobility  Outcome Measure Help needed turning from your back to your side while in a flat bed without using bedrails?: A Little Help needed moving from lying on your back to sitting on the side of a flat bed without using bedrails?: A Little Help needed moving to and from a bed to a chair (including a wheelchair)?: A Little Help needed standing up from a chair using your arms (e.g., wheelchair or bedside chair)?: A Little Help needed to walk in hospital room?: A Little Help needed climbing 3-5 steps with a railing? : A Little 6 Click Score: 18    End of Session Equipment Utilized During Treatment: Gait belt Activity Tolerance: Patient tolerated treatment well;No increased pain Patient left: in chair;with call bell/phone within reach;with chair alarm set Nurse Communication: Mobility status PT Visit Diagnosis: Other abnormalities of gait and mobility (R26.89);Difficulty in walking, not elsewhere classified (R26.2)    Time: 1324-4010 PT Time Calculation (min) (ACUTE ONLY): 14 min   Charges:   PT Evaluation $PT Eval Moderate Complexity: 1 Mod   PT General Charges $$ ACUTE PT VISIT: 1 Visit        11:44 AM, 11/04/23 Cassie Taylor, PT, DPT Physical Therapist - [facility]  [phone] (ASCOM)    Cassie Taylor 11/04/2023, 11:41 AM

## 2023-11-04 NOTE — Progress Notes (Incomplete)
 [facility]  ROUNDING NOTE   Subjective:   Patient seen sitting up in bed   Sodium 130  Objective:  Vital signs in last 24 hours:  Temp:  [97.6 F (36.4 C)-98 F (36.7 C)] 97.6 F (36.4 C) (04/03 1040) Pulse Rate:  [79-98] 88 (04/03 1040) Resp:  [17-23] 23 (04/03 0740) BP: (142-157)/(75-92) 150/88 (04/03 1040) SpO2:  [94 %-96 %] 96 % (04/03 1040)  Weight change:  Filed Weights   10/31/23 1050  Weight: 45.8 kg    Intake/Output: I/O last 3 completed shifts: In: 680 [P.O.:680] Out: -    Intake/Output this shift:  Total I/O In: 240 [P.O.:240] Out: -   Physical Exam: General: NAD  Head: Normocephalic, atraumatic. Moist oral mucosal membranes  Eyes: Anicteric  Lungs:  Clear to auscultation, normal effort  Heart: Regular rate and rhythm  Abdomen:  Soft, nontender, nondistended  Extremities: No peripheral edema.  Neurologic: Alert and oriented to self, moving all four extremities  Skin: No lesions       Basic Metabolic Panel: Recent Labs  Lab 10/31/23 1048 10/31/23 1850 11/01/23 0436 11/01/23 1628 11/02/23 0557 11/03/23 0357 11/03/23 1315 11/03/23 2116 11/04/23 0429  NA 117*   < > 120* 123* 124* 120* 119* 124* 130*  K 2.7*   < > 3.5 3.3* 3.3* 3.8 3.9  --  3.7  CL 75*   < > 82* 84* 85* 86* 86*  --  93*  CO2 33*   < > 29 31 30 27 24   --  28  GLUCOSE 102*   < > 88 101* 107* 104* 146*  --  119*  BUN 7*   < > 7* 9 5* 7* 6*  --  9  CREATININE 0.51   < > 0.40* 0.44 0.42* 0.33* 0.40*  --  0.42*  CALCIUM 8.1*   < > 7.8* 7.6* 8.0* 7.7* 7.9*  --  8.6*  MG 2.0  --  1.8  --  1.9 1.9  --   --  2.1  PHOS  --   --  2.3*  --  2.9 2.6  --   --  3.2   < > = values in this interval not displayed.    Liver Function Tests: Recent Labs  Lab 10/31/23 1048 11/01/23 0436 11/03/23 1315  AST 49* 40 45*  ALT 65* 54* 65*  ALKPHOS 74 61 70  BILITOT 0.8 0.6 0.6  PROT 6.4* 5.4* 5.9*  ALBUMIN 2.6* 2.1* 2.5*   No results for input(s): "LIPASE", "AMYLASE" in  the last 168 hours. No results for input(s): "AMMONIA" in the last 168 hours.  CBC: Recent Labs  Lab 10/31/23 1048 11/01/23 0436 11/02/23 0557 11/03/23 0357 11/04/23 0429  WBC 15.9* 15.6* 11.7* 13.4* 13.3*  NEUTROABS 13.1*  --   --   --   --   HGB 12.0 11.6* 12.6 11.6* 12.6  HCT 33.6* 32.1* 34.9* 32.1* 35.7*  MCV 87.5 88.4 86.6 87.0 87.9  PLT 442* 441* 469* 434* 528*    Cardiac Enzymes: No results for input(s): "CKTOTAL", "CKMB", "CKMBINDEX", "TROPONINI" in the last 168 hours.  BNP: Invalid input(s): "POCBNP"  CBG: No results for input(s): "GLUCAP" in the last 168 hours.  Microbiology: Results for orders placed or performed during the hospital encounter of 10/31/23  Resp panel by RT-PCR (RSV, Flu A&B, Covid) Anterior Nasal Swab     Status: None   Collection Time: 10/31/23 10:49 AM   Specimen: Anterior Nasal Swab  Result Value Ref  Range Status   SARS Coronavirus 2 by RT PCR NEGATIVE NEGATIVE Final    Comment: (NOTE) SARS-CoV-2 target nucleic acids are NOT DETECTED.  The SARS-CoV-2 RNA is generally detectable in upper respiratory specimens during the acute phase of infection. The lowest concentration of SARS-CoV-2 viral copies this assay can detect is 138 copies/mL. A negative result does not preclude SARS-Cov-2 infection and should not be used as the sole basis for treatment or other patient management decisions. A negative result may occur with  improper specimen collection/handling, submission of specimen other than nasopharyngeal swab, presence of viral mutation(s) within the areas targeted by this assay, and inadequate number of viral copies(<138 copies/mL). A negative result must be combined with clinical observations, patient history, and epidemiological information. The expected result is Negative.  Fact Sheet for Patients:  BloggerCourse.com  Fact Sheet for Healthcare Providers:  SeriousBroker.it  This  test is no t yet approved or cleared by the [place] FDA and  has been authorized for detection and/or diagnosis of SARS-CoV-2 by FDA under an Emergency Use Authorization (EUA). This EUA will remain  in effect (meaning this test can be used) for the duration of the COVID-19 declaration under Section 564(b)(1) of the Act, 21 U.S.C.section 360bbb-3(b)(1), unless the authorization is terminated  or revoked sooner.       Influenza A by PCR NEGATIVE NEGATIVE Final   Influenza B by PCR NEGATIVE NEGATIVE Final    Comment: (NOTE) The Xpert Xpress SARS-CoV-2/FLU/RSV plus assay is intended as an aid in the diagnosis of influenza from Nasopharyngeal swab specimens and should not be used as a sole basis for treatment. Nasal washings and aspirates are unacceptable for Xpert Xpress SARS-CoV-2/FLU/RSV testing.  Fact Sheet for Patients: BloggerCourse.com  Fact Sheet for Healthcare Providers: SeriousBroker.it  This test is not yet approved or cleared by the [place] FDA and has been authorized for detection and/or diagnosis of SARS-CoV-2 by FDA under an Emergency Use Authorization (EUA). This EUA will remain in effect (meaning this test can be used) for the duration of the COVID-19 declaration under Section 564(b)(1) of the Act, 21 U.S.C. section 360bbb-3(b)(1), unless the authorization is terminated or revoked.     Resp Syncytial Virus by PCR NEGATIVE NEGATIVE Final    Comment: (NOTE) Fact Sheet for Patients: BloggerCourse.com  Fact Sheet for Healthcare Providers: SeriousBroker.it  This test is not yet approved or cleared by the [place] FDA and has been authorized for detection and/or diagnosis of SARS-CoV-2 by FDA under an Emergency Use Authorization (EUA). This EUA will remain in effect (meaning this test can be used) for the duration of the COVID-19 declaration under  Section 564(b)(1) of the Act, 21 U.S.C. section 360bbb-3(b)(1), unless the authorization is terminated or revoked.  Performed at [facility], [address] [place], [place] [address]     Coagulation Studies: No results for input(s): "LABPROT", "INR" in the last 72 hours.  Urinalysis: No results for input(s): "COLORURINE", "LABSPEC", "PHURINE", "GLUCOSEU", "HGBUR", "BILIRUBINUR", "KETONESUR", "PROTEINUR", "UROBILINOGEN", "NITRITE", "LEUKOCYTESUR" in the last 72 hours.  Invalid input(s): "APPERANCEUR"     Imaging: No results found.   Medications:    cefTRIAXone (ROCEPHIN)  IV 2 g (11/03/23 1425)    atorvastatin  10 mg Oral Daily   diltiazem  240 mg Oral Daily   feeding supplement  237 mL Oral TID BM   folic acid  1 mg Oral Daily   latanoprost  1 drop Both Eyes QHS   lisinopril  5 mg Oral  Daily   loratadine  10 mg Oral Daily   melatonin  10 mg Oral QHS   multivitamin with minerals  1 tablet Oral Daily   sodium chloride flush  3 mL Intravenous Q12H   sodium chloride  1 g Oral TID WC   thiamine  100 mg Oral Daily   timolol  1 drop Both Eyes BID   acetaminophen **OR** acetaminophen, guaiFENesin-dextromethorphan, ondansetron **OR** ondansetron (ZOFRAN) IV, polyethylene glycol  Assessment/ Plan:  Ms. Cassie Taylor is a 83 y.o.  female  with medical problems of dementia, hypertension, hyperlipidemia.   Hyponatremia Differential seems to be tea and toast diet.  After her respiratory illness patient was not eating well.  She has been drinking mostly fluids. She is noted to have very low albumin of 2.1 suggesting malnutrition. Sodium decreased today to 120.  Patient complaining of dry mouth and thirst, likely drank more than restricted yesterday.  Will order tolvaptan 15 mg today.  Will resume 1200 mL fluid restriction tomorrow.  Patient encouraged to increase oral solute intake.  2.  Pneumonia Multifocal infiltrate by chest x-ray. Currently receiving  broad-spectrum antibiotics-ceftriaxone and azithromycin. Primary team will continue management.   3.  Fall CT head without contrast shows small volume acute subarachnoid hemorrhage.  Neurology recommends no interventions and will follow-up outpatient.   LOS: 4 Cassie Taylor 4/3/202511:24 AM

## 2023-11-04 NOTE — Progress Notes (Signed)
 [facility]  ROUNDING NOTE   Subjective:   Patient received 1 dose of tolvaptan yesterday.  Good response. Sodium has increased to 130. Oral intake still appears to be poor. She worked with physical therapy with close supervision.  Objective:  Vital signs in last 24 hours:  Temp:  [97.6 F (36.4 C)-98.5 F (36.9 C)] 98.5 F (36.9 C) (04/03 1543) Pulse Rate:  [79-98] 89 (04/03 1543) Resp:  [17-23] 21 (04/03 1040) BP: (125-157)/(66-92) 125/66 (04/03 1543) SpO2:  [94 %-97 %] 97 % (04/03 1543)  Weight change:  Filed Weights   10/31/23 1050  Weight: 45.8 kg    Intake/Output: I/O last 3 completed shifts: In: 680 [P.O.:680] Out: -    Intake/Output this shift:  Total I/O In: 480 [P.O.:480] Out: -   Physical Exam: General: NAD  Head: Normocephalic, atraumatic. Moist oral mucosal membranes  Eyes: Anicteric  Lungs:  Clear to auscultation, normal effort  Heart: Regular rate and rhythm  Abdomen:  Soft, nontender, nondistended  Extremities: No peripheral edema.  Neurologic: Alert and oriented to self, moving all four extremities  Skin: No lesions       Basic Metabolic Panel: Recent Labs  Lab 10/31/23 1048 10/31/23 1850 11/01/23 0436 11/01/23 1628 11/02/23 0557 11/03/23 0357 11/03/23 1315 11/03/23 2116 11/04/23 0429  NA 117*   < > 120* 123* 124* 120* 119* 124* 130*  K 2.7*   < > 3.5 3.3* 3.3* 3.8 3.9  --  3.7  CL 75*   < > 82* 84* 85* 86* 86*  --  93*  CO2 33*   < > 29 31 30 27 24   --  28  GLUCOSE 102*   < > 88 101* 107* 104* 146*  --  119*  BUN 7*   < > 7* 9 5* 7* 6*  --  9  CREATININE 0.51   < > 0.40* 0.44 0.42* 0.33* 0.40*  --  0.42*  CALCIUM 8.1*   < > 7.8* 7.6* 8.0* 7.7* 7.9*  --  8.6*  MG 2.0  --  1.8  --  1.9 1.9  --   --  2.1  PHOS  --   --  2.3*  --  2.9 2.6  --   --  3.2   < > = values in this interval not displayed.    Liver Function Tests: Recent Labs  Lab 10/31/23 1048 11/01/23 0436 11/03/23 1315  AST 49* 40 45*  ALT 65*  54* 65*  ALKPHOS 74 61 70  BILITOT 0.8 0.6 0.6  PROT 6.4* 5.4* 5.9*  ALBUMIN 2.6* 2.1* 2.5*   No results for input(s): "LIPASE", "AMYLASE" in the last 168 hours. No results for input(s): "AMMONIA" in the last 168 hours.  CBC: Recent Labs  Lab 10/31/23 1048 11/01/23 0436 11/02/23 0557 11/03/23 0357 11/04/23 0429  WBC 15.9* 15.6* 11.7* 13.4* 13.3*  NEUTROABS 13.1*  --   --   --   --   HGB 12.0 11.6* 12.6 11.6* 12.6  HCT 33.6* 32.1* 34.9* 32.1* 35.7*  MCV 87.5 88.4 86.6 87.0 87.9  PLT 442* 441* 469* 434* 528*    Cardiac Enzymes: No results for input(s): "CKTOTAL", "CKMB", "CKMBINDEX", "TROPONINI" in the last 168 hours.  BNP: Invalid input(s): "POCBNP"  CBG: No results for input(s): "GLUCAP" in the last 168 hours.  Microbiology: Results for orders placed or performed during the hospital encounter of 10/31/23  Resp panel by RT-PCR (RSV, Flu A&B, Covid) Anterior Nasal Swab  Status: None   Collection Time: 10/31/23 10:49 AM   Specimen: Anterior Nasal Swab  Result Value Ref Range Status   SARS Coronavirus 2 by RT PCR NEGATIVE NEGATIVE Final    Comment: (NOTE) SARS-CoV-2 target nucleic acids are NOT DETECTED.  The SARS-CoV-2 RNA is generally detectable in upper respiratory specimens during the acute phase of infection. The lowest concentration of SARS-CoV-2 viral copies this assay can detect is 138 copies/mL. A negative result does not preclude SARS-Cov-2 infection and should not be used as the sole basis for treatment or other patient management decisions. A negative result may occur with  improper specimen collection/handling, submission of specimen other than nasopharyngeal swab, presence of viral mutation(s) within the areas targeted by this assay, and inadequate number of viral copies(<138 copies/mL). A negative result must be combined with clinical observations, patient history, and epidemiological information. The expected result is Negative.  Fact Sheet for  Patients:  BloggerCourse.com  Fact Sheet for Healthcare Providers:  SeriousBroker.it  This test is no t yet approved or cleared by the [place] FDA and  has been authorized for detection and/or diagnosis of SARS-CoV-2 by FDA under an Emergency Use Authorization (EUA). This EUA will remain  in effect (meaning this test can be used) for the duration of the COVID-19 declaration under Section 564(b)(1) of the Act, 21 U.S.C.section 360bbb-3(b)(1), unless the authorization is terminated  or revoked sooner.       Influenza A by PCR NEGATIVE NEGATIVE Final   Influenza B by PCR NEGATIVE NEGATIVE Final    Comment: (NOTE) The Xpert Xpress SARS-CoV-2/FLU/RSV plus assay is intended as an aid in the diagnosis of influenza from Nasopharyngeal swab specimens and should not be used as a sole basis for treatment. Nasal washings and aspirates are unacceptable for Xpert Xpress SARS-CoV-2/FLU/RSV testing.  Fact Sheet for Patients: BloggerCourse.com  Fact Sheet for Healthcare Providers: SeriousBroker.it  This test is not yet approved or cleared by the [place] FDA and has been authorized for detection and/or diagnosis of SARS-CoV-2 by FDA under an Emergency Use Authorization (EUA). This EUA will remain in effect (meaning this test can be used) for the duration of the COVID-19 declaration under Section 564(b)(1) of the Act, 21 U.S.C. section 360bbb-3(b)(1), unless the authorization is terminated or revoked.     Resp Syncytial Virus by PCR NEGATIVE NEGATIVE Final    Comment: (NOTE) Fact Sheet for Patients: BloggerCourse.com  Fact Sheet for Healthcare Providers: SeriousBroker.it  This test is not yet approved or cleared by the [place] FDA and has been authorized for detection and/or diagnosis of SARS-CoV-2 by FDA under an Emergency  Use Authorization (EUA). This EUA will remain in effect (meaning this test can be used) for the duration of the COVID-19 declaration under Section 564(b)(1) of the Act, 21 U.S.C. section 360bbb-3(b)(1), unless the authorization is terminated or revoked.  Performed at [facility], [address] [place], [place] [address]     Coagulation Studies: No results for input(s): "LABPROT", "INR" in the last 72 hours.  Urinalysis: No results for input(s): "COLORURINE", "LABSPEC", "PHURINE", "GLUCOSEU", "HGBUR", "BILIRUBINUR", "KETONESUR", "PROTEINUR", "UROBILINOGEN", "NITRITE", "LEUKOCYTESUR" in the last 72 hours.  Invalid input(s): "APPERANCEUR"     Imaging: DG ESOPHAGUS W SINGLE CM (SOL OR THIN BA) Result Date: 11/04/2023 CLINICAL DATA:  Patient with complaints of dysphagia and increased oral secretions. EXAM: ESOPHAGUS/BARIUM SWALLOW/TABLET STUDY TECHNIQUE: Single contrast examination was performed using thin barium liquid. The patient was observed with fluoroscopy swallowing a 13 mm barium sulphate  tablet. FLUOROSCOPY TIME:  Radiation Exposure Index (as provided by the fluoroscopic device): 2 minute 36 seconds, 14.9 mGy COMPARISON:  Chest radiograph dated 10/31/2023 FINDINGS: Normal pharyngeal anatomy and motility. No frank tracheal aspiration. Contrast flowed freely through the esophagus without evidence of a stricture or mass. Transient focal outpouching of the posterolateral hypopharynx on LPO positioning. Normal esophageal mucosa without evidence of irregularity or ulceration. Moderate esophageal dysmotility is seen with proximal contrast bolus escape and associated tertiary contractions. Gastroesophageal reflux not fully evaluated secondary to dysmotility and limited exam. No definite hiatal hernia was demonstrated. A 13 mm barium tablet was administered which transited through the esophagus and esophagogastric junction without delay. IMPRESSION: 1. Limited esophagram secondary to  patient's inability to stand or tolerate full exam. 2. Normal pharyngeal anatomy and motility without tracheal aspiration. Transient focal outpouching of the posterolateral hypopharynx on LPO positioning may reflect superimposition of the normal pyriformis sinus rather than diverticulum. 3. Esophageal dysmotility with proximal contrast bolus escape and associated tertiary contractions. 4. No evidence of esophageal stricture with successful passage of 13 mm barium tablet. This exam was performed by Cassie Boss PA-C, and was supervised and interpreted by Dr. Roda Shutters. Electronically Signed   By: Agustin Cree M.D.   On: 11/04/2023 11:34     Medications:      atorvastatin  10 mg Oral Daily   diltiazem  240 mg Oral Daily   feeding supplement  237 mL Oral TID BM   folic acid  1 mg Oral Daily   latanoprost  1 drop Both Eyes QHS   lisinopril  5 mg Oral Daily   loratadine  10 mg Oral Daily   multivitamin with minerals  1 tablet Oral Daily   QUEtiapine  25 mg Oral PC supper   sodium chloride flush  3 mL Intravenous Q12H   sodium chloride  1 g Oral TID WC   thiamine  100 mg Oral Daily   timolol  1 drop Both Eyes BID   acetaminophen **OR** acetaminophen, guaiFENesin-dextromethorphan, ondansetron **OR** ondansetron (ZOFRAN) IV, polyethylene glycol  Assessment/ Plan:  Cassie Taylor is a 83 y.o.  female  with medical problems of dementia, hypertension, hyperlipidemia.   Hyponatremia Differential seems to be tea and toast diet.  After her respiratory illness patient was not eating well.  She has been drinking mostly clear fluids. She is noted to have very low albumin of 2.5 suggesting malnutrition. Sodium improved to 130 today after tolvaptan administration.  We will follow along.  2.  Pneumonia Multifocal infiltrate by chest x-ray. Currently receiving broad-spectrum antibiotics-ceftriaxone and azithromycin. Primary team will continue management.   3.  Fall CT head without contrast shows small  volume acute subarachnoid hemorrhage.  Neurology recommends no interventions and will follow-up outpatient.   LOS: 4 Cassie Taylor 4/3/20254:59 PM

## 2023-11-04 NOTE — Progress Notes (Signed)
  I have reviewed and concur with this student's documentation.   Allyn Kenner , BSN, RN

## 2023-11-04 NOTE — Progress Notes (Signed)
 Occupational Therapy Treatment Patient Details Name: Cassie Taylor MRN: [id number] DOB: 1941/05/09 Today's Date: 11/04/2023   History of present illness Pt is an 83 y.o. female who presents to the ED due to ground-level fall. Admitted for management of hyponatremia, subarachnoid hemorrhage and CAP. PMH of dementia, hypertension, hyperlipidemia, chronic hyponatremia.   OT comments  Pt is seated in recliner on arrival. Pleasant and agreeable to OT session with nursing student finishing up providing meds. She denies pain. Pt performed STS with MOD I from recliner to RW and ambulated to the bathroom and out into the hallway ~60 feet using RW with SUP and no LOB. Standing grooming tasks performed with supervision at sink with and without UE support with no LOB.  Pt returned to recliner with all needs in place and will cont to require skilled acute OT services to maximize her safety and IND to return to PLOF.       If plan is discharge home, recommend the following:  A little help with walking and/or transfers;A little help with bathing/dressing/bathroom;Direct supervision/assist for financial management;Supervision due to cognitive status;Direct supervision/assist for medications management   Equipment Recommendations  BSC/3in1    Recommendations for Other Services      Precautions / Restrictions Precautions Precautions: Fall Restrictions Weight Bearing Restrictions Per Provider Order: No       Mobility Bed Mobility               General bed mobility comments: NT up in recliner pre/post session    Transfers Overall transfer level: Needs assistance Equipment used: Rolling walker (2 wheels) Transfers: Sit to/from Stand Sit to Stand: Modified independent (Device/Increase time)           General transfer comment: MOD I STS from recliner to RW and SUP for mobility in room and out into hallway ~60 feet     Balance Overall balance assessment: Needs  assistance Sitting-balance support: Feet supported Sitting balance-Leahy Scale: Good     Standing balance support: Reliant on assistive device for balance, Bilateral upper extremity supported Standing balance-Leahy Scale: Good Standing balance comment: steady with RW use                           ADL either performed or assessed with clinical judgement   ADL Overall ADL's : Needs assistance/impaired     Grooming: Oral care;Wash/dry face;Standing Grooming Details (indicate cue type and reason): at sink in bathroom                             Functional mobility during ADLs: Rolling walker (2 wheels);Supervision/safety      Extremity/Trunk Assessment              Occupational psychologist Communication: No apparent difficulties   Cognition Arousal: Alert Behavior During Therapy: WFL for tasks assessed/performed Cognition: History of cognitive impairments, Cognition impaired                               Following commands: Intact        Cueing   Cueing Techniques: Verbal cues  Exercises      Shoulder Instructions       General Comments VSS throughout session    Pertinent Vitals/ Pain  Pain Assessment Pain Assessment: No/denies pain  Home Living                               Home Equipment: Rollator (4 wheels)   Additional Comments: per granddaughter likely handicapped accessible bathroom as pt and her spouse live together at ALF and he has more physical needs      Prior Functioning/Environment              Frequency  Min 2X/week        Progress Toward Goals  OT Goals(current goals can now be found in the care plan section)  Progress towards OT goals: Progressing toward goals  Acute Rehab OT Goals Patient Stated Goal: return to ALF OT Goal Formulation: With patient Time For Goal Achievement: 11/15/23 Potential to Achieve Goals: Good   Plan      Co-evaluation                 AM-PAC OT "6 Clicks" Daily Activity     Outcome Measure   Help from another person eating meals?: None Help from another person taking care of personal grooming?: None Help from another person toileting, which includes using toliet, bedpan, or urinal?: A Little Help from another person bathing (including washing, rinsing, drying)?: A Little Help from another person to put on and taking off regular upper body clothing?: None Help from another person to put on and taking off regular lower body clothing?: A Little 6 Click Score: 21    End of Session Equipment Utilized During Treatment: Rolling walker (2 wheels)  OT Visit Diagnosis: Other abnormalities of gait and mobility (R26.89);Unsteadiness on feet (R26.81);Muscle weakness (generalized) (M62.81)   Activity Tolerance Patient tolerated treatment well   Patient Left with call bell/phone within reach;in chair;with chair alarm set   Nurse Communication Mobility status        Time: 6045-4098 OT Time Calculation (min): 20 min  Charges: OT General Charges $OT Visit: 1 Visit OT Treatments $Self Care/Home Management : 8-22 mins  Jara Feider, OTR/L  11/04/23, 12:55 PM   Kathryn Cosby E Rhian Asebedo 11/04/2023, 12:53 PM

## 2023-11-04 NOTE — Progress Notes (Addendum)
 Palliative Care Progress Note, Assessment & Plan   Patient Name: Cassie Taylor       Date: 11/04/2023 DOB: 1941/03/13  Age: 83 y.o. MRN#: [id number] Attending Physician: Gillis Santa, MD Primary Care Physician: Nonda Lou, MD Admit Date: 10/31/2023  Subjective: Patient is out of bed and sitting in recliner at bedside.  She is awake, alert, and acknowledges my presence.  She is able to make her wishes known.  She is completing her session with PT at bedside.  No family or friends present during my visit.  HPI: 83 y.o. female  with past medical history of dementia, hypertension, hyperlipidemia, chronic hyponatremia admitted on 10/31/2023 with ground-level fall.   Upon arrival to ED, CT of the head was notable for small volume acute subarachnoid hemorrhage with small hemorrhagic contusion in the anterior left temporal lobe.  Neurosurgery was consulted and recommended CTA.  CTA was negative for any large vessel occlusions and no aneurysms present.   Patient is being treated for hyponatremia.  Nephrology was consulted.   PMT was consulted to support patient and family with goals of care discussions.   Summary of counseling/coordination of care: Extensive chart review completed prior to meeting patient including labs, vital signs, imaging, progress notes, orders, and available advanced directive documents from current and previous encounters.   After reviewing the patient's chart and assessing the patient at bedside, I spoke with patient in regards to symptom management and goals of care.   Symptoms assessed.  Patient denies chest pain or discomfort as she had mentioned yesterday.  She denies headache, N/V/D, cramping, and other acute issues at this time.  No adjustment to [facility]  needed.  Discussed importance of functional status as significant indicators of patient's overall prognosis.  She shares that she used to walk a lot and it is not as easy for her to walk now.  Reviewed that continued walking and mobility will contribute to more positive prognosis.  Additionally, discussed patient's p.o. intake as a significant contributor to her overall prognosis.  Ensure is at bedside.  Patient was offered Ensure and she took a few sips.  Discussed that though she might not eat large portions during meals she can supplement her caloric intake and increase her protein levels by taking snacks and supplements in between meals.  She endorsed understanding and continue to drink the Ensure during my visit.  Patient remains unable to engage in goals of care discussions or medical decision making independently at this time.  As per chart review, patient has become agitated/irritated in the middle of the night and has received Haldol IV with minimal positive response.  Given patient's history of dementia and advanced age, I recommend a trial dose of Seroquel 25 mg p.o. nightly.  Additionally, patient has been taking Celexa 20 mg p.o. daily for approximately last 6 months.  I would recommend restarting this home med.  Recommendations given to attending.  After meeting with the patient, I spoke with patient's son/HC POA Arlys John over the phone.  Medical update given.  Discussed use of Seroquel. Additionally, discussed patient's improvement in sodium and use of tolvaptan.  Education provided on hyponatremia.  Questions and concerns were addressed regarding restarting/holding celexa  given sodium improvement to 130.  Therapeutic silence, active listening, and emotional support provided.  Arlys John would like to speak in person in regards to advanced care planning and plan of care.  We plan to meet bedside at 10:30 AM tomorrow morning to continue goals of care discussions.  PMT will continue to follow and  support patient and family throughout her hospitalization.  Physical Exam Vitals reviewed.  Constitutional:      General: She is not in acute distress.    Appearance: She is normal weight. She is not ill-appearing.  HENT:     Head: Normocephalic.     Nose: Nose normal.     Mouth/Throat:     Mouth: Mucous membranes are moist.  Eyes:     Pupils: Pupils are equal, round, and reactive to light.  Cardiovascular:     Rate and Rhythm: Normal rate.  Pulmonary:     Effort: Pulmonary effort is normal.  Abdominal:     Palpations: Abdomen is soft.  Musculoskeletal:     Comments: MAETC  Skin:    General: Skin is warm and dry.  Neurological:     Mental Status: She is alert.     Comments: Oriented to self  Psychiatric:        Mood and Affect: Mood normal.        Behavior: Behavior normal.        Judgment: Judgment normal.             Total Time 50 minutes   Time spent includes: Detailed review of medical records (labs, imaging, vital signs), medically appropriate exam (mental status, respiratory, cardiac, skin), discussed with treatment team, counseling and educating patient, family and staff, documenting clinical information, medication management and coordination of care.  Samara Deist L. Bonita Quin, DNP, FNP-BC Palliative Medicine Team

## 2023-11-05 DIAGNOSIS — J189 Pneumonia, unspecified organism: Secondary | ICD-10-CM | POA: Diagnosis not present

## 2023-11-05 DIAGNOSIS — I609 Nontraumatic subarachnoid hemorrhage, unspecified: Secondary | ICD-10-CM | POA: Diagnosis not present

## 2023-11-05 DIAGNOSIS — F0390 Unspecified dementia without behavioral disturbance: Secondary | ICD-10-CM | POA: Diagnosis not present

## 2023-11-05 DIAGNOSIS — E871 Hypo-osmolality and hyponatremia: Secondary | ICD-10-CM | POA: Diagnosis not present

## 2023-11-05 LAB — PROCALCITONIN: Procalcitonin: <0.1 ng/mL

## 2023-11-05 LAB — CBC
HCT: 31.4 % — ABNORMAL LOW (ref 36.0–46.0)
Hemoglobin: 11 g/dL — ABNORMAL LOW (ref 12.0–15.0)
MCH: 31.3 pg (ref 26.0–34.0)
MCHC: 35 g/dL (ref 30.0–36.0)
MCV: 89.5 fL (ref 80.0–100.0)
Platelets: 474 10*3/uL — ABNORMAL HIGH (ref 150–400)
RBC: 3.51 MIL/uL — ABNORMAL LOW (ref 3.87–5.11)
RDW: 12.9 % (ref 11.5–15.5)
WBC: 12.5 10*3/uL — ABNORMAL HIGH (ref 4.0–10.5)
nRBC: 0 % (ref 0.0–0.2)

## 2023-11-05 LAB — BASIC METABOLIC PANEL WITH GFR
Anion gap: 6 (ref 5–15)
BUN: 13 mg/dL (ref 8–23)
CO2: 28 mmol/L (ref 22–32)
Calcium: 8.2 mg/dL — ABNORMAL LOW (ref 8.9–10.3)
Chloride: 96 mmol/L — ABNORMAL LOW (ref 98–111)
Creatinine, Ser: 0.53 mg/dL (ref 0.44–1.00)
GFR, Estimated: >60 mL/min (ref >60)
Glucose, Bld: 103 mg/dL — ABNORMAL HIGH (ref 70–99)
Potassium: 4 mmol/L (ref 3.5–5.1)
Sodium: 130 mmol/L — ABNORMAL LOW (ref 135–145)

## 2023-11-05 MED ORDER — SODIUM CHLORIDE 1 G PO TABS
2.0000 g | ORAL_TABLET | Freq: Three times a day (TID) | ORAL | Status: DC
  Administered 2023-11-05 – 2023-11-06 (×2): 2 g via ORAL
  Filled 2023-11-05 (×2): qty 2

## 2023-11-05 MED ORDER — ENSURE ENLIVE PO LIQD: 237.0000 mL | Freq: Two times a day (BID) | ORAL | 11 refills | Status: AC

## 2023-11-05 MED ORDER — ACETAMINOPHEN 325 MG PO TABS: 650.0000 mg | ORAL_TABLET | Freq: Four times a day (QID) | ORAL | Status: AC | PRN

## 2023-11-05 MED ORDER — PREDNISONE 10 MG PO TABS
5.0000 mg | ORAL_TABLET | Freq: Every day | ORAL | Status: DC
  Administered 2023-11-05 – 2023-11-06 (×2): 5 mg via ORAL
  Filled 2023-11-05 (×2): qty 1

## 2023-11-05 MED ORDER — QUETIAPINE FUMARATE 25 MG PO TABS: 25.0000 mg | ORAL_TABLET | ORAL | 5 refills | Status: AC

## 2023-11-05 NOTE — Progress Notes (Signed)
 [facility]  ROUNDING NOTE   Subjective:   Patient seen sitting up in bed Nursing student at bedside No family present Sodium remains 130  Objective:  Vital signs in last 24 hours:  Temp:  [98.2 F (36.8 C)-98.7 F (37.1 C)] 98.2 F (36.8 C) (04/04 0736) Pulse Rate:  [80-98] 86 (04/04 0736) Resp:  [16-18] 18 (04/04 0435) BP: (110-141)/(52-74) 135/74 (04/04 0736) SpO2:  [96 %-98 %] 97 % (04/04 0736)  Weight change:  Filed Weights   10/31/23 1050  Weight: 45.8 kg    Intake/Output: I/O last 3 completed shifts: In: 480 [P.O.:480] Out: -    Intake/Output this shift:  Total I/O In: 200 [P.O.:200] Out: -   Physical Exam: General: NAD  Head: Normocephalic, atraumatic. Moist oral mucosal membranes  Eyes: Anicteric  Lungs:  Clear to auscultation, normal effort  Heart: Regular rate and rhythm  Abdomen:  Soft, nontender, nondistended  Extremities: No peripheral edema.  Neurologic: Alert and oriented to self, moving all four extremities  Skin: No lesions       Basic Metabolic Panel: Recent Labs  Lab 10/31/23 1048 10/31/23 1850 11/01/23 0436 11/01/23 1628 11/02/23 0557 11/03/23 0357 11/03/23 1315 11/03/23 2116 11/04/23 0429 11/05/23 0452  NA 117*   < > 120*   < > 124* 120* 119* 124* 130* 130*  K 2.7*   < > 3.5   < > 3.3* 3.8 3.9  --  3.7 4.0  CL 75*   < > 82*   < > 85* 86* 86*  --  93* 96*  CO2 33*   < > 29   < > 30 27 24   --  28 28  GLUCOSE 102*   < > 88   < > 107* 104* 146*  --  119* 103*  BUN 7*   < > 7*   < > 5* 7* 6*  --  9 13  CREATININE 0.51   < > 0.40*   < > 0.42* 0.33* 0.40*  --  0.42* 0.53  CALCIUM 8.1*   < > 7.8*   < > 8.0* 7.7* 7.9*  --  8.6* 8.2*  MG 2.0  --  1.8  --  1.9 1.9  --   --  2.1  --   PHOS  --   --  2.3*  --  2.9 2.6  --   --  3.2  --    < > = values in this interval not displayed.    Liver Function Tests: Recent Labs  Lab 10/31/23 1048 11/01/23 0436 11/03/23 1315  AST 49* 40 45*  ALT 65* 54* 65*  ALKPHOS 74  61 70  BILITOT 0.8 0.6 0.6  PROT 6.4* 5.4* 5.9*  ALBUMIN 2.6* 2.1* 2.5*   No results for input(s): "LIPASE", "AMYLASE" in the last 168 hours. No results for input(s): "AMMONIA" in the last 168 hours.  CBC: Recent Labs  Lab 10/31/23 1048 11/01/23 0436 11/02/23 0557 11/03/23 0357 11/04/23 0429 11/05/23 0452  WBC 15.9* 15.6* 11.7* 13.4* 13.3* 12.5*  NEUTROABS 13.1*  --   --   --   --   --   HGB 12.0 11.6* 12.6 11.6* 12.6 11.0*  HCT 33.6* 32.1* 34.9* 32.1* 35.7* 31.4*  MCV 87.5 88.4 86.6 87.0 87.9 89.5  PLT 442* 441* 469* 434* 528* 474*    Cardiac Enzymes: No results for input(s): "CKTOTAL", "CKMB", "CKMBINDEX", "TROPONINI" in the last 168 hours.  BNP: Invalid input(s): "POCBNP"  CBG: No results for  input(s): "GLUCAP" in the last 168 hours.  Microbiology: Results for orders placed or performed during the hospital encounter of 10/31/23  Resp panel by RT-PCR (RSV, Flu A&B, Covid) Anterior Nasal Swab     Status: None   Collection Time: 10/31/23 10:49 AM   Specimen: Anterior Nasal Swab  Result Value Ref Range Status   SARS Coronavirus 2 by RT PCR NEGATIVE NEGATIVE Final    Comment: (NOTE) SARS-CoV-2 target nucleic acids are NOT DETECTED.  The SARS-CoV-2 RNA is generally detectable in upper respiratory specimens during the acute phase of infection. The lowest concentration of SARS-CoV-2 viral copies this assay can detect is 138 copies/mL. A negative result does not preclude SARS-Cov-2 infection and should not be used as the sole basis for treatment or other patient management decisions. A negative result may occur with  improper specimen collection/handling, submission of specimen other than nasopharyngeal swab, presence of viral mutation(s) within the areas targeted by this assay, and inadequate number of viral copies(<138 copies/mL). A negative result must be combined with clinical observations, patient history, and epidemiological information. The expected result is  Negative.  Fact Sheet for Patients:  BloggerCourse.com  Fact Sheet for Healthcare Providers:  SeriousBroker.it  This test is no t yet approved or cleared by the [place] FDA and  has been authorized for detection and/or diagnosis of SARS-CoV-2 by FDA under an Emergency Use Authorization (EUA). This EUA will remain  in effect (meaning this test can be used) for the duration of the COVID-19 declaration under Section 564(b)(1) of the Act, 21 U.S.C.section 360bbb-3(b)(1), unless the authorization is terminated  or revoked sooner.       Influenza A by PCR NEGATIVE NEGATIVE Final   Influenza B by PCR NEGATIVE NEGATIVE Final    Comment: (NOTE) The Xpert Xpress SARS-CoV-2/FLU/RSV plus assay is intended as an aid in the diagnosis of influenza from Nasopharyngeal swab specimens and should not be used as a sole basis for treatment. Nasal washings and aspirates are unacceptable for Xpert Xpress SARS-CoV-2/FLU/RSV testing.  Fact Sheet for Patients: BloggerCourse.com  Fact Sheet for Healthcare Providers: SeriousBroker.it  This test is not yet approved or cleared by the [place] FDA and has been authorized for detection and/or diagnosis of SARS-CoV-2 by FDA under an Emergency Use Authorization (EUA). This EUA will remain in effect (meaning this test can be used) for the duration of the COVID-19 declaration under Section 564(b)(1) of the Act, 21 U.S.C. section 360bbb-3(b)(1), unless the authorization is terminated or revoked.     Resp Syncytial Virus by PCR NEGATIVE NEGATIVE Final    Comment: (NOTE) Fact Sheet for Patients: BloggerCourse.com  Fact Sheet for Healthcare Providers: SeriousBroker.it  This test is not yet approved or cleared by the [place] FDA and has been authorized for detection and/or diagnosis of  SARS-CoV-2 by FDA under an Emergency Use Authorization (EUA). This EUA will remain in effect (meaning this test can be used) for the duration of the COVID-19 declaration under Section 564(b)(1) of the Act, 21 U.S.C. section 360bbb-3(b)(1), unless the authorization is terminated or revoked.  Performed at [facility], [address] [place], [place] [address]     Coagulation Studies: No results for input(s): "LABPROT", "INR" in the last 72 hours.  Urinalysis: No results for input(s): "COLORURINE", "LABSPEC", "PHURINE", "GLUCOSEU", "HGBUR", "BILIRUBINUR", "KETONESUR", "PROTEINUR", "UROBILINOGEN", "NITRITE", "LEUKOCYTESUR" in the last 72 hours.  Invalid input(s): "APPERANCEUR"     Imaging: DG ESOPHAGUS W SINGLE CM (SOL OR THIN BA) Result Date: 11/04/2023 CLINICAL  DATA:  Patient with complaints of dysphagia and increased oral secretions. EXAM: ESOPHAGUS/BARIUM SWALLOW/TABLET STUDY TECHNIQUE: Single contrast examination was performed using thin barium liquid. The patient was observed with fluoroscopy swallowing a 13 mm barium sulphate tablet. FLUOROSCOPY TIME:  Radiation Exposure Index (as provided by the fluoroscopic device): 2 minute 36 seconds, 14.9 mGy COMPARISON:  Chest radiograph dated 10/31/2023 FINDINGS: Normal pharyngeal anatomy and motility. No frank tracheal aspiration. Contrast flowed freely through the esophagus without evidence of a stricture or mass. Transient focal outpouching of the posterolateral hypopharynx on LPO positioning. Normal esophageal mucosa without evidence of irregularity or ulceration. Moderate esophageal dysmotility is seen with proximal contrast bolus escape and associated tertiary contractions. Gastroesophageal reflux not fully evaluated secondary to dysmotility and limited exam. No definite hiatal hernia was demonstrated. A 13 mm barium tablet was administered which transited through the esophagus and esophagogastric junction without delay. IMPRESSION:  1. Limited esophagram secondary to patient's inability to stand or tolerate full exam. 2. Normal pharyngeal anatomy and motility without tracheal aspiration. Transient focal outpouching of the posterolateral hypopharynx on LPO positioning may reflect superimposition of the normal pyriformis sinus rather than diverticulum. 3. Esophageal dysmotility with proximal contrast bolus escape and associated tertiary contractions. 4. No evidence of esophageal stricture with successful passage of 13 mm barium tablet. This exam was performed by Pattricia Boss PA-C, and was supervised and interpreted by Dr. Roda Shutters. Electronically Signed   By: Agustin Cree M.D.   On: 11/04/2023 11:34     Medications:      atorvastatin  10 mg Oral Daily   diltiazem  240 mg Oral Daily   feeding supplement  237 mL Oral TID BM   folic acid  1 mg Oral Daily   latanoprost  1 drop Both Eyes QHS   lisinopril  5 mg Oral Daily   loratadine  10 mg Oral Daily   multivitamin with minerals  1 tablet Oral Daily   QUEtiapine  25 mg Oral PC supper   sodium chloride flush  3 mL Intravenous Q12H   sodium chloride  1 g Oral TID WC   thiamine  100 mg Oral Daily   timolol  1 drop Both Eyes BID   acetaminophen **OR** acetaminophen, guaiFENesin-dextromethorphan, ondansetron **OR** ondansetron (ZOFRAN) IV, polyethylene glycol  Assessment/ Plan:  Ms. Cassie Taylor is a 83 y.o.  female  with medical problems of dementia, hypertension, hyperlipidemia.   Hyponatremia Differential seems to be tea and toast diet.  After her respiratory illness patient was not eating well.  She has been drinking mostly clear fluids. She is noted to have very low albumin of 2.5 suggesting malnutrition. Patient see 1 dose of tolvaptan during this admission.  Sodium remains 130 today.  Nursing staff encouraged to provide Ensure and boost aside from water to increase solute intake.  This is encouraged outpatient as well.  2.  Pneumonia Multifocal infiltrate by chest  x-ray. Completed broad-spectrum antibiotics-ceftriaxone and azithromycin. Primary team will continue management.   3.  Fall CT head without contrast shows small volume acute subarachnoid hemorrhage.  Neurology recommends no interventions and will follow-up outpatient.   LOS: 5 Cassie Taylor 4/4/202512:24 PM

## 2023-11-05 NOTE — Progress Notes (Signed)
 Occupational Therapy Treatment Patient Details Name: Cassie Taylor MRN: [id number] DOB: 1941/06/23 Today's Date: 11/05/2023   History of present illness Pt is an 83 y.o. female who presents to the ED due to ground-level fall. Admitted for management of hyponatremia, subarachnoid hemorrhage and CAP. PMH of dementia, hypertension, hyperlipidemia, chronic hyponatremia.   OT comments  Pt is seated in recliner on arrival. Son present, pleasant and agreeable to OT session. She denies pain. Pt performed STS with CGA/SUP and increased time and cueing for safety. Pt required CGA/SBA for mobility ~80 feet using RW with cueing for longer steps vs small shuffling steps. Per son, this is a change in gait pattern for her and he also reports the BUE shaking is not her norm, but she has always had the head tremor. MD requesting call to [facility] to see if they can take pt back to facility with increased supervision/assist for showering, etc. To ensure safety on return d/t dementia and son wanting pt to DC to her home environment. OT had to leave VM with [facility] requesting return call for discussion. Son also wanting return call.  Pt returned to recliner with all needs in place and will cont to require skilled acute OT services to maximize her safety and IND to return to PLOF.       If plan is discharge home, recommend the following:  A little help with walking and/or transfers;A little help with bathing/dressing/bathroom;Direct supervision/assist for financial management;Supervision due to cognitive status;Direct supervision/assist for medications management   Equipment Recommendations  BSC/3in1    Recommendations for Other Services      Precautions / Restrictions Precautions Precautions: Fall Restrictions Weight Bearing Restrictions Per Provider Order: No       Mobility Bed Mobility               General bed mobility comments: NT up in recliner pre/post session    Transfers Overall  transfer level: Needs assistance Equipment used: Rolling walker (2 wheels) Transfers: Sit to/from Stand Sit to Stand: Supervision           General transfer comment: cueing for STS from recliner with SUP, ambulated ~80 feet using RW with CGA/SBA and cueing for larger steps as pt with shuffling gait     Balance Overall balance assessment: Needs assistance Sitting-balance support: Feet supported Sitting balance-Leahy Scale: Good     Standing balance support: Reliant on assistive device for balance, Bilateral upper extremity supported Standing balance-Leahy Scale: Fair Standing balance comment: CGA using RW                           ADL either performed or assessed with clinical judgement   ADL                                         General ADL Comments: pt remains CGA to SUP level for ADL performance d/t weakness    Extremity/Trunk Assessment              Vision       Perception     Praxis     Communication Communication Communication: No apparent difficulties   Cognition Arousal: Alert Behavior During Therapy: WFL for tasks assessed/performed Cognition: History of cognitive impairments, Cognition impaired  Following commands: Intact        Cueing   Cueing Techniques: Verbal cues  Exercises Other Exercises Other Exercises: Son present for session and would prefer pt return to ALF with therapy services at facility as long as facility can provide level of supervision/assist needed with new weakness. Other Exercises: Attempted to call [facility], but had to leave a VM. Son requesting a call back regarding his mom after speaking with [facility].    Shoulder Instructions       General Comments      Pertinent Vitals/ Pain       Pain Assessment Pain Assessment: No/denies pain  Home Living                                          Prior Functioning/Environment               Frequency  Min 2X/week        Progress Toward Goals  OT Goals(current goals can now be found in the care plan section)  Progress towards OT goals: Progressing toward goals  Acute Rehab OT Goals Patient Stated Goal: return to ALF OT Goal Formulation: With patient Time For Goal Achievement: 11/15/23 Potential to Achieve Goals: Good  Plan      Co-evaluation                 AM-PAC OT "6 Clicks" Daily Activity     Outcome Measure   Help from another person eating meals?: None Help from another person taking care of personal grooming?: None Help from another person toileting, which includes using toliet, bedpan, or urinal?: A Little Help from another person bathing (including washing, rinsing, drying)?: A Little Help from another person to put on and taking off regular upper body clothing?: None Help from another person to put on and taking off regular lower body clothing?: A Little 6 Click Score: 21    End of Session Equipment Utilized During Treatment: Rolling walker (2 wheels)  OT Visit Diagnosis: Other abnormalities of gait and mobility (R26.89);Unsteadiness on feet (R26.81);Muscle weakness (generalized) (M62.81)   Activity Tolerance Patient tolerated treatment well   Patient Left with call bell/phone within reach;in chair;with chair alarm set   Nurse Communication Mobility status        Time: 4098-1191 OT Time Calculation (min): 26 min  Charges: OT General Charges $OT Visit: 1 Visit OT Treatments $Therapeutic Activity: 23-37 mins  Azana Kiesler, OTR/L  11/05/23, 1:07 PM   Eyonna Sandstrom E Ishanvi Mcquitty 11/05/2023, 1:03 PM

## 2023-11-05 NOTE — Plan of Care (Signed)

## 2023-11-05 NOTE — Progress Notes (Signed)
 Palliative Care Progress Note, Assessment & Plan   Patient Name: Cassie Taylor       Date: 11/05/2023 DOB: 11-03-1940  Age: 83 y.o. MRN#: [id number] Attending Physician: Gillis Santa, MD Primary Care Physician: Nonda Lou, MD Admit Date: 10/31/2023  Subjective: Patient is out of bed and sitting in recliner.  She is awake, alert, and acknowledges my presence.  She is able to make her wishes known.  Her son is at bedside during my visit.  HPI: 83 y.o. female  with past medical history of dementia, hypertension, hyperlipidemia, chronic hyponatremia admitted on 10/31/2023 with ground-level fall.   Upon arrival to ED, CT of the head was notable for small volume acute subarachnoid hemorrhage with small hemorrhagic contusion in the anterior left temporal lobe.  Neurosurgery was consulted and recommended CTA.  CTA was negative for any large vessel occlusions and no aneurysms present.   Patient is being treated for hyponatremia.  Nephrology was consulted.   PMT was consulted to support patient and family with goals of care discussions.   Summary of counseling/coordination of care: Extensive chart review completed prior to meeting patient including labs, vital signs, imaging, progress notes, orders, and available advanced directive documents from current and previous encounters.   After reviewing the patient's chart and assessing the patient at bedside, I spoke with patient in regards to symptom management and goals of care.   Somes assessed.  Patient endorses she feels well.  She has no acute complaints at this time.  No adjustment to [facility] needed.  After visiting with the patient, I spoke with her son/HCPOA Arlys John, at his request, outside of the patient's room.  Space and opportunity provided for  Arlys John to share his thoughts and emotions regarding mother's current medical situation.  He discussed prognosis, trajectory of dementia, stages of dementia, and expectations at end-of-life.  He inquired about when to call hospice.  Hospice philosophy, aging in place, and hospice parameters discussed in detail.  Outpatient palliative services also discussed in detail.  Reviewed that patient is not hospice appropriate at this time.  Judie Grieve accepting of outpatient palliative services to follow.  Consult placed.  Patient is going to be evaluated by [facility] to see if she can return to their facilities.  In light of patient transferring to another medical facility, Arlys John and I reviewed a MOST form.    I completed a MOST form today with Arlys John. He outlined wishes for the following treatment decisions for his mother:  Cardiopulmonary Resuscitation: Do Not Attempt Resuscitation (DNR/No CPR)  Medical Interventions: Limited Additional Interventions: Use medical treatment, IV fluids and cardiac monitoring as indicated, DO NOT USE intubation or mechanical ventilation. May consider use of less invasive airway support such as BiPAP or CPAP. Also provide comfort measures. Transfer to the hospital if indicated. Avoid intensive care.   Antibiotics: Antibiotics if indicated  IV Fluids: IV fluids for a defined trial period  Feeding Tube: No feeding tube    MOST form sent to [facility] records for download to ACP tab of epic.  Hard copy of MOST form placed in patient's shadow chart.  Additionally, I completed a [facility] DNR form, sent that to medical records via email, and  placed hardcopy of [facility] in patient's shadow chart.  Goals are clear.  Disposition pending and TOC following closely.  I am off service after today but will have a PMT colleague to follow-up with patient and family throughout her hospitalization.  Physical Exam Vitals reviewed.  Constitutional:      General: She is not in acute distress.     Appearance: She is normal weight. She is not toxic-appearing.  HENT:     Head: Normocephalic.     Mouth/Throat:     Mouth: Mucous membranes are moist.  Eyes:     Pupils: Pupils are equal, round, and reactive to light.  Cardiovascular:     Rate and Rhythm: Normal rate.     Pulses: Normal pulses.  Pulmonary:     Effort: Pulmonary effort is normal.  Abdominal:     Palpations: Abdomen is soft.  Musculoskeletal:        General: Normal range of motion.     Comments: MAETC  Skin:    General: Skin is warm and dry.  Neurological:     Mental Status: She is alert.     Comments: Oriented to self  Psychiatric:        Mood and Affect: Mood normal.        Behavior: Behavior normal.             Total Time 50 minutes   Time spent includes: Detailed review of medical records (labs, imaging, vital signs), medically appropriate exam (mental status, respiratory, cardiac, skin), discussed with treatment team, counseling and educating patient, family and staff, documenting clinical information, medication management and coordination of care.  Samara Deist L. Bonita Quin, DNP, FNP-BC Palliative Medicine Team

## 2023-11-05 NOTE — Progress Notes (Addendum)
 Pt has been intermittently refusing tele. Pt A&Ox1 and has tele sitter. Pt is sometimes agreeable to tele at is now refusing tele, provider aware. Will continue to educate and reorient pt. Tele tech also aware, currently has tele on stand by.

## 2023-11-05 NOTE — Progress Notes (Signed)
 [facility] Progress Note  Patient: Cassie Taylor    BJY:[id number]  DOA: 10/31/2023     Date of Service: the patient was seen and examined on 11/05/2023  Chief Complaint  Patient presents with   Fall        Brief hospital course:  KAMARYN GRIMLEY is a 83 y.o. female with medical history significant of dementia, hypertension, hyperlipidemia, chronic hyponatremia, who presents to the ED due to ground-level fall.   History obtained through chart review and from patient's daughter-in-law at bedside, given patient's underlying dementia.  Per chart review, Patient had a ground-level fall earlier today that was unwitnessed.  Facility staff were not sure if she hit her head, however she complained of her head hurting.  The facility noted she had been also complaining of dizziness for the last few days.  This is after recent diagnosis of RSV and prolonged poor appetite afterwards.  RSV diagnosis was approximately 10 days ago.   At this time, [name] denies any pain including chest pain, abdominal pain.  She denies any shortness of breath, cough, nausea, vomiting, diarrhea.  She denies any dizziness, headache or focal weakness.   ED course: On arrival to the ED, BP 134/67, HR 72.  O2 sat 93% RA, afebrile  97.9. WBC of 15.9, platelets 442, Na 117, K 2.7, BUN 7, Cr 0.51, bicarb 33, AST 49, ALT 65, GFR above 60.  Troponin negative.  COVID-19, influenza and RSV PCR negative.  Urinalysis negative.   CT of the head notable for small volume acute subarachnoid hemorrhage with small hemorrhagic contusion in the anterior left temporal lobe.   Neurosurgery consulted, recommended CTA, and if negative, no further recommendations.  CTA negative for any significant findings. [facility] contacted for admission and further management as below.   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 Serum osmolality 247  low S/p NS bolus in the ED, patient was on LR which has been discontinued. Continue fluid restriction 1.5 L/day Na 117--120--124--119---130 Continue to trend sodium level Sodium correction range 8-10 mmol in 24 hours S/p Salt Tab 2 g po TID on 4/2 and decreased 1 g po tid on 4/3 Nephrology consult appreciated S/p tolvaptan 15 mg one-time dose given on 4/2   # Hypokalemia, potassium repleted.  Resolved # Hypophosphatemia, Phos repleted.  Resolved Monitor electrolytes and replete as needed.  # Community-acquired pneumonia CXR: Patchy bibasilar airspace opacities, right worse than left, suspicious for multifocal pneumonia. S/p ceftriaxone x 5 days and azithromycin 3 days Procalcitonin negative Trend WBC count S/p Mucinex 600 BID, started Robitussin DM as needed and  Claritin for symptomatic management 4/4 WBC 12.5, still elevated but procalcitonin negative.  # Subarachnoid hemorrhage s/p ground-level fall CT head positive for subarachnoid hemorrhage CTA head negative for any large vessel occlusion, no aneurysms. Seen by neurosurgery, recommended no intervention if no abnormality on CT angiogram Patient may follow-up with neurosurgery as an outpatient   # Hypertension Continue Cardizem and lisinopril Discontinued HCTZ due to hyponatremia Monitor BP and titrate medications accordingly   Esophageal dysmotility causing dysphagia,  SLP eval done, recommended continue current diet no oropharyngeal dysphagia Barium swallow study negative for esophageal stricture, shows esophageal dysmotility.    Body mass index is 18.47 kg/m.  Interventions:  Diet: Regular diet, fluid striction 1.5 L/day DVT Prophylaxis: Therapeutic Anticoagulation with subarachnoid hemorrhage    Advance goals of care discussion: DNR-limited  Family Communication: family was  present at bedside, at the time of interview.  The pt provided permission to discuss medical plan with the family. Opportunity was  given to ask question and all questions were answered satisfactorily.   Disposition:  Pt is from ALF, admitted with fall, SAH and Hyponatremia, still has low Na, which precludes a safe discharge. Discharge to ALF, when stable, most likely discharge tomorrow a.m. if sodium level remains stable.   Subjective: No significant events overnight, patient was sitting comfortably on the recliner, she did walk with the PT, had little bit shakiness of arms, but denied any complaints.   Physical Exam: General: NAD, lying comfortably Appear in no distress, affect appropriate Eyes: PERRLA ENT: Oral Mucosa Clear, moist  Neck: no JVD,  Cardiovascular: S1 and S2 Present, no Murmur,  Respiratory: good respiratory effort, Bilateral Air entry equal and Decreased, no Crackles, no wheezes Abdomen: Bowel Sound present, Soft and no tenderness,  Skin: no rashes Extremities: no Pedal edema, no calf tenderness Neurologic: without any new focal findings Gait not checked due to patient safety concerns  Vitals:   11/05/23 0027 11/05/23 0435 11/05/23 0736 11/05/23 1226  BP: 110/67 (!) 141/68 135/74 133/64  Pulse: 80 98 86 89  Resp: 18 18    Temp: 98.7 F (37.1 C) 98.5 F (36.9 C) 98.2 F (36.8 C) 98.1 F (36.7 C)  TempSrc: Oral Oral    SpO2: 96% 98% 97% 96%  Weight:      Height:        Intake/Output Summary (Last 24 hours) at 11/05/2023 1535 Last data filed at 11/05/2023 1054 Gross per 24 hour  Intake 200 ml  Output --  Net 200 ml    Filed Weights   10/31/23 1050  Weight: 45.8 kg    Data Reviewed: I have personally reviewed and interpreted daily labs, tele strips, imagings as discussed above. I reviewed all nursing notes, pharmacy notes, vitals, pertinent old records I have discussed plan of care as described above with RN and patient/family.  CBC: Recent Labs  Lab 10/31/23 1048 11/01/23 0436 11/02/23 0557 11/03/23 0357 11/04/23 0429 11/05/23 0452  WBC 15.9* 15.6* 11.7* 13.4* 13.3*  12.5*  NEUTROABS 13.1*  --   --   --   --   --   HGB 12.0 11.6* 12.6 11.6* 12.6 11.0*  HCT 33.6* 32.1* 34.9* 32.1* 35.7* 31.4*  MCV 87.5 88.4 86.6 87.0 87.9 89.5  PLT 442* 441* 469* 434* 528* 474*   Basic Metabolic Panel: Recent Labs  Lab 10/31/23 1048 10/31/23 1850 11/01/23 0436 11/01/23 1628 11/02/23 0557 11/03/23 0357 11/03/23 1315 11/03/23 2116 11/04/23 0429 11/05/23 0452  NA 117*   < > 120*   < > 124* 120* 119* 124* 130* 130*  K 2.7*   < > 3.5   < > 3.3* 3.8 3.9  --  3.7 4.0  CL 75*   < > 82*   < > 85* 86* 86*  --  93* 96*  CO2 33*   < > 29   < > 30 27 24   --  28 28  GLUCOSE 102*   < > 88   < > 107* 104* 146*  --  119* 103*  BUN 7*   < > 7*   < > 5* 7* 6*  --  9 13  CREATININE 0.51   < > 0.40*   < > 0.42* 0.33* 0.40*  --  0.42* 0.53  CALCIUM 8.1*   < > 7.8*   < > 8.0*  7.7* 7.9*  --  8.6* 8.2*  MG 2.0  --  1.8  --  1.9 1.9  --   --  2.1  --   PHOS  --   --  2.3*  --  2.9 2.6  --   --  3.2  --    < > = values in this interval not displayed.    Studies: No results found.    Scheduled Meds:  atorvastatin  10 mg Oral Daily   diltiazem  240 mg Oral Daily   feeding supplement  237 mL Oral TID BM   folic acid  1 mg Oral Daily   latanoprost  1 drop Both Eyes QHS   lisinopril  5 mg Oral Daily   loratadine  10 mg Oral Daily   multivitamin with minerals  1 tablet Oral Daily   predniSONE  5 mg Oral Daily   QUEtiapine  25 mg Oral PC supper   sodium chloride flush  3 mL Intravenous Q12H   sodium chloride  2 g Oral TID WC   thiamine  100 mg Oral Daily   timolol  1 drop Both Eyes BID   Continuous Infusions:   PRN Meds: acetaminophen **OR** acetaminophen, guaiFENesin-dextromethorphan, ondansetron **OR** ondansetron (ZOFRAN) IV, polyethylene glycol  Time spent: 40 minutes  Author: Gillis Santa. MD [facility] 11/05/2023 3:35 PM  To reach On-call, see care teams to locate the attending and reach out to them via [email or web address]. If 7PM-7AM, please contact  night-coverage If you still have difficulty reaching the attending provider, please page the [facility] (Director on Call) for [facility] on amion for assistance.

## 2023-11-05 NOTE — Progress Notes (Signed)
 Physical Therapy Treatment Patient Details Name: Cassie Taylor MRN: [id number] DOB: 02-Mar-1941 Today's Date: 11/05/2023   History of Present Illness Pt is an 83 y.o. female who presents to the ED due to ground-level fall. Admitted for management of hyponatremia, subarachnoid hemorrhage and CAP. PMH of dementia, hypertension, hyperlipidemia, chronic hyponatremia.    PT Comments  Pt in recliner on entry, appears quite somnolent, pt asks if time to get back to bed, she is ready for nap. Pt is agreeable to AMB a bit prior to that, she remains diligent about remaining mobile and maintaining strength. Pt able to advance distance today, albeit not as robust appearing, she perseveres. Pt assisted back to bed at end of session. Will continue to follow.    If plan is discharge home, recommend the following: A little help with walking and/or transfers;A little help with bathing/dressing/bathroom;Assistance with cooking/housework;Assist for transportation;Supervision due to cognitive status;Direct supervision/assist for medications management;Direct supervision/assist for financial management   Can travel by private vehicle        Equipment Recommendations  None recommended by PT    Recommendations for Other Services       Precautions / Restrictions Precautions Precautions: Fall Restrictions Weight Bearing Restrictions Per Provider Order: No     Mobility  Bed Mobility Overal bed mobility: Needs Assistance Bed Mobility: Sit to Supine       Sit to supine: Mod assist   General bed mobility comments: assist with legs    Transfers Overall transfer level: Needs assistance Equipment used: Rolling walker (2 wheels) Transfers: Sit to/from Stand Sit to Stand: Supervision           General transfer comment: mod effort, no LOB    Ambulation/Gait Ambulation/Gait assistance: Contact guard assist, Supervision Gait Distance (Feet): 120 Feet Assistive device: Rolling walker (2  wheels) Gait Pattern/deviations: Shuffle, Knee flexed in stance - left, Knee flexed in stance - right       General Gait Details: appears less alert today, but moving a little farther due to motivation   Stairs             Wheelchair Mobility     Tilt Bed    Modified Rankin (Stroke Patients Only)       Balance                                            Communication Communication Communication: No apparent difficulties  Cognition Arousal: Alert Behavior During Therapy: WFL for tasks assessed/performed   PT - Cognitive impairments: No apparent impairments                                Cueing    Exercises      General Comments        Pertinent Vitals/Pain Pain Assessment Pain Assessment: No/denies pain    Home Living                          Prior Function            PT Goals (current goals can now be found in the care plan section) Acute Rehab PT Goals PT Goal Formulation: Patient unable to participate in goal setting    Frequency    Min 2X/week      PT Plan  Co-evaluation              AM-PAC PT "6 Clicks" Mobility   Outcome Measure  Help needed turning from your back to your side while in a flat bed without using bedrails?: A Lot Help needed moving from lying on your back to sitting on the side of a flat bed without using bedrails?: A Lot Help needed moving to and from a bed to a chair (including a wheelchair)?: A Little Help needed standing up from a chair using your arms (e.g., wheelchair or bedside chair)?: A Little Help needed to walk in hospital room?: A Little Help needed climbing 3-5 steps with a railing? : A Lot 6 Click Score: 15    End of Session   Activity Tolerance: Patient tolerated treatment well;No increased pain Patient left: with call bell/phone within reach;in bed;with bed alarm set Nurse Communication: Mobility status PT Visit Diagnosis: Other abnormalities  of gait and mobility (R26.89);Difficulty in walking, not elsewhere classified (R26.2)     Time: 1610-9604 PT Time Calculation (min) (ACUTE ONLY): 15 min  Charges:    $Therapeutic Activity: 8-22 mins PT General Charges $$ ACUTE PT VISIT: 1 Visit                 4:41 PM, 11/05/23 Rosamaria Lints, PT, DPT Physical Therapist - [facility]  [phone] (ASCOM)    Brent Noto C 11/05/2023, 4:40 PM

## 2023-11-06 DIAGNOSIS — E871 Hypo-osmolality and hyponatremia: Secondary | ICD-10-CM | POA: Diagnosis not present

## 2023-11-06 LAB — BASIC METABOLIC PANEL WITH GFR
Anion gap: 7 (ref 5–15)
BUN: 15 mg/dL (ref 8–23)
CO2: 27 mmol/L (ref 22–32)
Calcium: 8.3 mg/dL — ABNORMAL LOW (ref 8.9–10.3)
Chloride: 96 mmol/L — ABNORMAL LOW (ref 98–111)
Creatinine, Ser: 0.44 mg/dL (ref 0.44–1.00)
GFR, Estimated: >60 mL/min (ref >60)
Glucose, Bld: 96 mg/dL (ref 70–99)
Potassium: 4.2 mmol/L (ref 3.5–5.1)
Sodium: 130 mmol/L — ABNORMAL LOW (ref 135–145)

## 2023-11-06 LAB — CBC
HCT: 33.6 % — ABNORMAL LOW (ref 36.0–46.0)
Hemoglobin: 11.3 g/dL — ABNORMAL LOW (ref 12.0–15.0)
MCH: 31 pg (ref 26.0–34.0)
MCHC: 33.6 g/dL (ref 30.0–36.0)
MCV: 92.3 fL (ref 80.0–100.0)
Platelets: 462 10*3/uL — ABNORMAL HIGH (ref 150–400)
RBC: 3.64 MIL/uL — ABNORMAL LOW (ref 3.87–5.11)
RDW: 13.2 % (ref 11.5–15.5)
WBC: 15.6 10*3/uL — ABNORMAL HIGH (ref 4.0–10.5)
nRBC: 0 % (ref 0.0–0.2)

## 2023-11-06 NOTE — Discharge Summary (Signed)
 [facility] Discharge Summary   Patient: Cassie Taylor:[id number]  PCP: Nonda Lou, MD  Date of admission: 10/31/2023   Date of discharge:  11/06/2023     Discharge Diagnoses:  Principal Problem:   Hyponatremia Active Problems:   SAH (subarachnoid hemorrhage) (HCC)   CAP (community acquired pneumonia)   Dementia without behavioral disturbance (HCC)   Hypertension   Leukocytosis   Hypokalemia   Admitted From: ALF Disposition:  ALF/ILF   Recommendations for Outpatient Follow-up:  Follow-up with PCP, patient should be seen by an MD in 1 to 2 days.  Repeat CBC and BMP in 1 week.  Held Celexa for 1 week due to hyponatremia, can be resumed when sodium normalized. Started Seroquel for possible sundowning and delirium. Continue protein supplement. Follow up LABS/TEST:  BMP and CBC in 1 wk   Follow-up Information     Taylor, Cassie Lucy, MD Follow up in 1 week(s).   Specialty: Family Medicine Contact information: [address] [place] [address] [phone]                Diet recommendation: Regular diet (added salt for 1 wk)   Activity: The patient is advised to gradually reintroduce usual activities, as tolerated  Discharge Condition: stable  Code Status: DNR   History of present illness: As per the H and P dictated on admission Hospital Course:  Cassie Taylor is a 83 y.o. female with medical history significant of dementia, hypertension, hyperlipidemia, chronic hyponatremia, who presents to the ED due to ground-level fall.   History obtained through chart review and from patient's daughter-in-law at bedside, given patient's underlying dementia.  Per chart review, Patient had a ground-level fall earlier today that was unwitnessed.  Facility staff were not sure if she hit her head, however she complained of her head hurting.  The facility noted she had been also complaining of dizziness for the last few  days.  This is after recent diagnosis of RSV and prolonged poor appetite afterwards.  RSV diagnosis was approximately 10 days ago.   At this time, Mrs. Proffit denies any pain including chest pain, abdominal pain.  She denies any shortness of breath, cough, nausea, vomiting, diarrhea.  She denies any dizziness, headache or focal weakness.   ED course: On arrival to the ED, BP 134/67, HR 72.  O2 sat 93% RA, afebrile  97.9. WBC of 15.9, platelets 442, Na 117, K 2.7, BUN 7, Cr 0.51, bicarb 33, AST 49, ALT 65, GFR above 60.  Troponin negative.  COVID-19, influenza and RSV PCR negative.  Urinalysis negative.   CT of the head notable for small volume acute subarachnoid hemorrhage with small hemorrhagic contusion in the anterior left temporal lobe.   Neurosurgery consulted, recommended CTA, and if negative, no further recommendations.  CTA negative for any significant findings. [facility] contacted for admission and further management as below.     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 Serum osmolality 247 low. S/p NS bolus in the ED, patient was on LR which has been discontinued. Continue fluid restriction 1.5 L/day Na 117--120--124--119---130 stable. S/p Salt Tab 2 g po TID on 4/2 and decreased 1 g po tid on 4/3. Nephrology consult appreciated. S/p tolvaptan 15 mg one-time dose given on 4/2.  As per nephro no need of sodium chloride tablets on discharge, continue added salt to the diet and continue fluid restriction  1.5 to 2/day.  Repeat BMP after 1 week.     # Hypokalemia, potassium repleted.  Resolved # Hypophosphatemia, Phos repleted.  Resolved   # Community-acquired pneumonia CXR: Patchy bibasilar airspace opacities, right worse than left, suspicious for multifocal pneumonia. S/p ceftriaxone x 5 days and azithromycin 3 days Procalcitonin negative. S/p Mucinex 600 BID, started Robitussin DM as needed and   Claritin for symptomatic management 4/5 WBC 15.6, still elevated but procalcitonin negative.  Most likely reactive, repeat CBC after 1 week.   # Subarachnoid hemorrhage s/p ground-level fall CT head positive for subarachnoid hemorrhage CTA head negative for any large vessel occlusion, no aneurysms. Seen by neurosurgery, recommended no intervention if no abnormality on CT angiogram Patient may follow-up with neurosurgery as an outpatient if any symptoms such as  headache or dizziness.   # Hypertension Continue Cardizem and lisinopril. Discontinued HCTZ due to hyponatremia Monitor BP and titrate medications accordingly   # Esophageal dysmotility causing dysphagia,  SLP eval done, recommended continue current diet no oropharyngeal dysphagia Barium swallow study negative for esophageal stricture, shows esophageal dysmotility.   Body mass index is 18.47 kg/m.  Nutrition Interventions:  Patient was seen by physical therapy, who recommended Therapy, at ALF, which was arranged. On the day of the discharge the patient's vitals were stable, and no other acute medical condition were reported by patient. the patient was felt safe to be discharge at ALF/ILF with Therapy.  Consultants: Nephrology Procedures: None  Discharge Exam: General: Appear in no distress, no Rash; Oral Mucosa Clear, moist. Cardiovascular: S1 and S2 Present, no Murmur, Respiratory: normal respiratory effort, Bilateral Air entry present and no Crackles, no wheezes Abdomen: Bowel Sound present, Soft and no tenderness, no hernia Extremities: no Pedal edema, no calf tenderness Neurology: AAOx 1 due to dementia, CN grossly intact, no focal deficits. affect flat, and calm  Filed Weights   10/31/23 1050  Weight: 45.8 kg   Vitals:   11/06/23 0454 11/06/23 0800  BP: 135/67 135/66  Pulse: 86 88  Resp: 16 16  Temp: 98.1 F (36.7 C) 98.2 F (36.8 C)  SpO2: 96% 98%    DISCHARGE MEDICATION: Allergies as of 11/06/2023        Reactions   Codeine Nausea And Vomiting   Dizziness        Medication List     PAUSE taking these medications    citalopram 20 MG tablet Wait to take this until: November 12, 2023 Commonly known as: CELEXA Take 20 mg by mouth daily.       STOP taking these medications    Aspirin 81 MG Caps   Calcium Citrate Plus/Magnesium Tabs   CALCIUM CITRATE PO   cefdinir 300 MG capsule Commonly known as: OMNICEF   cephALEXin 500 MG capsule Commonly known as: KEFLEX   diltiazem 240 MG 24 hr capsule Commonly known as: TIAZAC   hydrochlorothiazide 12.5 MG tablet Commonly known as: HYDRODIURIL   ibuprofen 400 MG tablet Commonly known as: ADVIL   meclizine 25 MG tablet Commonly known as: ANTIVERT       TAKE these medications    acetaminophen 325 MG tablet Commonly known as: TYLENOL Take 2 tablets (650 mg total) by mouth every 6 (six) hours as needed for mild pain (pain score 1-3), fever, headache or moderate pain (pain score 4-6) (or Fever >/= 101).   alendronate 70 MG tablet Commonly known as: FOSAMAX Take 70 mg by mouth once a week.   atorvastatin 10 MG tablet Commonly known  as: LIPITOR Take 10 mg by mouth daily.   Calcium-Magnesium-Vitamin D 500-250-125 MG-MG-UNIT Tabs Take 1 tablet by mouth daily.   diltiazem 240 MG 24 hr capsule Commonly known as: DILACOR XR Take 240 mg by mouth daily.   feeding supplement Liqd Take 237 mLs by mouth 2 (two) times daily between meals.   Iron 325 (65 Fe) MG Tabs Take by mouth daily.   latanoprost 0.005 % ophthalmic solution Commonly known as: XALATAN Place 1 drop into both eyes at bedtime.   lisinopril 5 MG tablet Commonly known as: ZESTRIL Take 5 mg by mouth daily.   Melatonin 10 MG Tabs Take 10 mg by mouth at bedtime.   multivitamin tablet Take 1 tablet by mouth daily.   Omega-3 1000 MG Caps Take 2,000 mg by mouth daily.   predniSONE 5 MG tablet Commonly known as: DELTASONE Take 5 mg by mouth  daily.   QUEtiapine 25 MG tablet Commonly known as: SEROQUEL Take 1 tablet (25 mg total) by mouth daily after supper.   Timolol Maleate (Once-Daily) 0.5 % Soln Place 1 drop into both eyes in the morning and at bedtime.       Allergies  Allergen Reactions   Codeine Nausea And Vomiting    Dizziness   Discharge Instructions     Call MD for:  difficulty breathing, headache or visual disturbances   Complete by: As directed    Call MD for:  extreme fatigue   Complete by: As directed    Call MD for:  persistant dizziness or light-headedness   Complete by: As directed    Call MD for:  persistant nausea and vomiting   Complete by: As directed    Call MD for:  severe uncontrolled pain   Complete by: As directed    Call MD for:  temperature >100.4   Complete by: As directed    Diet general   Complete by: As directed    Needs extra salt for 1 week until sodium normalizes   Discharge instructions   Complete by: As directed    Follow-up with PCP, patient should be seen by an MD in 1 to 2 days.  Repeat CBC and BMP in 1 week.  Held Celexa for 1 week due to hyponatremia, can be resumed when sodium normalized. Started Seroquel for possible sundowning and delirium. Continue protein supplement.   Increase activity slowly   Complete by: As directed        The results of significant diagnostics from this hospitalization (including imaging, microbiology, ancillary and laboratory) are listed below for reference.    Significant Diagnostic Studies: DG ESOPHAGUS W SINGLE CM (SOL OR THIN BA) Result Date: 11/04/2023 CLINICAL DATA:  Patient with complaints of dysphagia and increased oral secretions. EXAM: ESOPHAGUS/BARIUM SWALLOW/TABLET STUDY TECHNIQUE: Single contrast examination was performed using thin barium liquid. The patient was observed with fluoroscopy swallowing a 13 mm barium sulphate tablet. FLUOROSCOPY TIME:  Radiation Exposure Index (as provided by the fluoroscopic device): 2 minute  36 seconds, 14.9 mGy COMPARISON:  Chest radiograph dated 10/31/2023 FINDINGS: Normal pharyngeal anatomy and motility. No frank tracheal aspiration. Contrast flowed freely through the esophagus without evidence of a stricture or mass. Transient focal outpouching of the posterolateral hypopharynx on LPO positioning. Normal esophageal mucosa without evidence of irregularity or ulceration. Moderate esophageal dysmotility is seen with proximal contrast bolus escape and associated tertiary contractions. Gastroesophageal reflux not fully evaluated secondary to dysmotility and limited exam. No definite hiatal hernia was demonstrated. A 13 mm barium  tablet was administered which transited through the esophagus and esophagogastric junction without delay. IMPRESSION: 1. Limited esophagram secondary to patient's inability to stand or tolerate full exam. 2. Normal pharyngeal anatomy and motility without tracheal aspiration. Transient focal outpouching of the posterolateral hypopharynx on LPO positioning may reflect superimposition of the normal pyriformis sinus rather than diverticulum. 3. Esophageal dysmotility with proximal contrast bolus escape and associated tertiary contractions. 4. No evidence of esophageal stricture with successful passage of 13 mm barium tablet. This exam was performed by Pattricia Boss PA-C, and was supervised and interpreted by Dr. Roda Shutters. Electronically Signed   By: Agustin Cree M.D.   On: 11/04/2023 11:34   DG Chest 1 View Result Date: 10/31/2023 CLINICAL DATA:  [id number] CAP (community acquired pneumonia) [id number] EXAM: CHEST  1 VIEW COMPARISON:  None Available. FINDINGS: The heart size and mediastinal contours are within normal limits. Aortic atherosclerosis. Patchy bibasilar airspace opacities, right worse than left. Probable trace bilateral pleural effusions. No pneumothorax. The visualized skeletal structures are unremarkable. IMPRESSION: Patchy bibasilar airspace opacities, right worse than left,  suspicious for multifocal pneumonia. Electronically Signed   By: Duanne Guess D.O.   On: 10/31/2023 14:13   CT ANGIO HEAD NECK W WO CM Result Date: 10/31/2023 CLINICAL DATA:  Neuro deficit, acute, stroke suspected. Intracranial hemorrhage. EXAM: CT ANGIOGRAPHY HEAD AND NECK WITH AND WITHOUT CONTRAST TECHNIQUE: Multidetector CT imaging of the head and neck was performed using the standard protocol during bolus administration of intravenous contrast. Multiplanar CT image reconstructions and MIPs were obtained to evaluate the vascular anatomy. Carotid stenosis measurements (when applicable) are obtained utilizing NASCET criteria, using the distal internal carotid diameter as the denominator. RADIATION DOSE REDUCTION: This exam was performed according to the departmental dose-optimization program which includes automated exposure control, adjustment of the mA and/or kV according to patient size and/or use of iterative reconstruction technique. CONTRAST:  75mL OMNIPAQUE IOHEXOL 350 MG/ML SOLN COMPARISON:  None Available. FINDINGS: CTA NECK FINDINGS Aortic arch: Standard branching with mild atherosclerosis. No significant stenosis of the arch vessel origins. Right carotid system: Patent with a small amount of calcified plaque at the carotid bifurcation. No evidence of a significant stenosis or dissection. Left carotid system: Patent with a small amount of calcified plaque at the carotid bifurcation. No evidence of a significant stenosis or dissection. Vertebral arteries: Patent and codominant without evidence of stenosis or dissection. Skeleton: No acute osseous abnormality or suspicious lesion. Cervical spine evaluated in detail on today's earlier dedicated spine CT. Other neck: No evidence of cervical lymphadenopathy or mass. Upper chest: Partially visualized small bilateral pleural effusions, right larger than left. Bronchial wall thickening. Partially visualized patchy consolidation and small nodular  ground-glass opacities in the right greater than left upper lobes. Mild mediastinal lymphadenopathy including a 1.1 cm short axis precarinal node and 1.5 cm subcarinal node. Review of the MIP images confirms the above findings CTA HEAD FINDINGS Anterior circulation: The internal carotid arteries are patent from skull base to carotid termini with minimal atherosclerosis not resulting in a significant stenosis. ACAs and MCAs are patent without evidence of a proximal or intra clues or significant proximal stenosis. No aneurysm or vascular malformation is identified. Posterior circulation: The intracranial vertebral arteries are widely patent to the basilar. Patent PICA, AICA, and SCA origins are visualized bilaterally. The basilar artery is widely patent. There is a large right posterior communicating artery with hypoplasia of the right P1 segment. Both PCAs are patent without evidence a significant proximal stenosis. No aneurysm or  vascular malformation is identified. Venous sinuses: As permitted by contrast timing, patent. Anatomic variants: Predominantly fetal type origin of the right PCA. Dominant left A2 segment. Duplicated left MCA. Review of the MIP images confirms the above findings IMPRESSION: 1. Mild atherosclerosis without a large vessel occlusion, significant stenosis, aneurysm, or vascular malformation in the head or neck. 2. Partially visualized small bilateral pleural effusions and patchy lung consolidation, possibly reflecting pneumonia. Recommend correlation with chest radiography. 3. Mild mediastinal lymphadenopathy, nonspecific but possibly reactive. Electronically Signed   By: Sebastian Ache M.D.   On: 10/31/2023 12:55   CT Head Wo Contrast Result Date: 10/31/2023 CLINICAL DATA:  Head trauma, minor (Age >= 65y). Fall. Dizziness. History of dementia. EXAM: CT HEAD WITHOUT CONTRAST TECHNIQUE: Contiguous axial images were obtained from the base of the skull through the vertex without intravenous  contrast. RADIATION DOSE REDUCTION: This exam was performed according to the departmental dose-optimization program which includes automated exposure control, adjustment of the mA and/or kV according to patient size and/or use of iterative reconstruction technique. COMPARISON:  None Available. FINDINGS: Brain: There is scattered small volume acute subarachnoid hemorrhage within cerebral sulci bilaterally, including over the frontal convexities near the vertex. Subarachnoid hemorrhage is also noted in the left sylvian fissure and quadrigeminal cistern. There is a 5 mm focus of intra-axial hemorrhage in the anterior left temporal lobe. No acute infarct, mass, midline shift, or extra-axial fluid collection is identified. Mild cerebral atrophy is within normal limits for age. Cerebral white matter hypodensities are nonspecific but compatible with mild chronic small vessel ischemic disease. Vascular: Calcified atherosclerosis at the skull base. No hyperdense vessel. Skull: No acute fracture or suspicious lesion. Sinuses/Orbits: Paranasal sinuses and mastoid air cells are clear. Bilateral cataract extraction. Other: None. Critical Value/emergent results were called by telephone at the time of interpretation on 10/31/2023 at 11:28 am to Dr. Roxan Hockey, who verbally acknowledged these results. IMPRESSION: 1. Small volume acute subarachnoid hemorrhage. 2. Small hemorrhagic contusion in the anterior left temporal lobe. 3. Mild chronic small vessel ischemic disease. Electronically Signed   By: Sebastian Ache M.D.   On: 10/31/2023 11:28   CT Cervical Spine Wo Contrast Result Date: 10/31/2023 CLINICAL DATA:  Neck trauma.  Fall.  Limited memory of injury. EXAM: CT CERVICAL SPINE WITHOUT CONTRAST TECHNIQUE: Multidetector CT imaging of the cervical spine was performed without intravenous contrast. Multiplanar CT image reconstructions were also generated. RADIATION DOSE REDUCTION: This exam was performed according to the departmental  dose-optimization program which includes automated exposure control, adjustment of the mA and/or kV according to patient size and/or use of iterative reconstruction technique. COMPARISON:  None Available. FINDINGS: Alignment: Physiologic. Skull base and vertebrae: No evidence of acute fracture or traumatic subluxation. Soft tissues and spinal canal: No prevertebral fluid or swelling. No visible canal hematoma. Disc levels: Multilevel spondylosis with disc space narrowing, endplate osteophytes and facet hypertrophy. No evidence of large disc herniation or high-grade foraminal narrowing. Upper chest: No acute findings. Other: Bilateral carotid atherosclerosis. Bilateral TMJ degenerative changes. IMPRESSION: 1. No evidence of acute cervical spine fracture, traumatic subluxation or static signs of instability. 2. Multilevel cervical spondylosis. Electronically Signed   By: Carey Bullocks M.D.   On: 10/31/2023 11:21    Microbiology: Recent Results (from the past 240 hours)  Resp panel by RT-PCR (RSV, Flu A&B, Covid) Anterior Nasal Swab     Status: None   Collection Time: 10/31/23 10:49 AM   Specimen: Anterior Nasal Swab  Result Value Ref Range Status  SARS Coronavirus 2 by RT PCR NEGATIVE NEGATIVE Final    Comment: (NOTE) SARS-CoV-2 target nucleic acids are NOT DETECTED.  The SARS-CoV-2 RNA is generally detectable in upper respiratory specimens during the acute phase of infection. The lowest concentration of SARS-CoV-2 viral copies this assay can detect is 138 copies/mL. A negative result does not preclude SARS-Cov-2 infection and should not be used as the sole basis for treatment or other patient management decisions. A negative result may occur with  improper specimen collection/handling, submission of specimen other than nasopharyngeal swab, presence of viral mutation(s) within the areas targeted by this assay, and inadequate number of viral copies(<138 copies/mL). A negative result must be  combined with clinical observations, patient history, and epidemiological information. The expected result is Negative.  Fact Sheet for Patients:  BloggerCourse.com  Fact Sheet for Healthcare Providers:  SeriousBroker.it  This test is no t yet approved or cleared by the [place] FDA and  has been authorized for detection and/or diagnosis of SARS-CoV-2 by FDA under an Emergency Use Authorization (EUA). This EUA will remain  in effect (meaning this test can be used) for the duration of the COVID-19 declaration under Section 564(b)(1) of the Act, 21 U.S.C.section 360bbb-3(b)(1), unless the authorization is terminated  or revoked sooner.       Influenza A by PCR NEGATIVE NEGATIVE Final   Influenza B by PCR NEGATIVE NEGATIVE Final    Comment: (NOTE) The Xpert Xpress SARS-CoV-2/FLU/RSV plus assay is intended as an aid in the diagnosis of influenza from Nasopharyngeal swab specimens and should not be used as a sole basis for treatment. Nasal washings and aspirates are unacceptable for Xpert Xpress SARS-CoV-2/FLU/RSV testing.  Fact Sheet for Patients: BloggerCourse.com  Fact Sheet for Healthcare Providers: SeriousBroker.it  This test is not yet approved or cleared by the [place] FDA and has been authorized for detection and/or diagnosis of SARS-CoV-2 by FDA under an Emergency Use Authorization (EUA). This EUA will remain in effect (meaning this test can be used) for the duration of the COVID-19 declaration under Section 564(b)(1) of the Act, 21 U.S.C. section 360bbb-3(b)(1), unless the authorization is terminated or revoked.     Resp Syncytial Virus by PCR NEGATIVE NEGATIVE Final    Comment: (NOTE) Fact Sheet for Patients: BloggerCourse.com  Fact Sheet for Healthcare Providers: SeriousBroker.it  This test is not yet  approved or cleared by the [place] FDA and has been authorized for detection and/or diagnosis of SARS-CoV-2 by FDA under an Emergency Use Authorization (EUA). This EUA will remain in effect (meaning this test can be used) for the duration of the COVID-19 declaration under Section 564(b)(1) of the Act, 21 U.S.C. section 360bbb-3(b)(1), unless the authorization is terminated or revoked.  Performed at [facility], [address] [place], [place] [address]      Labs: CBC: Recent Labs  Lab 10/31/23 1048 11/01/23 0436 11/02/23 0557 11/03/23 0357 11/04/23 0429 11/05/23 0452 11/06/23 0520  WBC 15.9*   < > 11.7* 13.4* 13.3* 12.5* 15.6*  NEUTROABS 13.1*  --   --   --   --   --   --   HGB 12.0   < > 12.6 11.6* 12.6 11.0* 11.3*  HCT 33.6*   < > 34.9* 32.1* 35.7* 31.4* 33.6*  MCV 87.5   < > 86.6 87.0 87.9 89.5 92.3  PLT 442*   < > 469* 434* 528* 474* 462*   < > = values in this interval not displayed.   Basic Metabolic  Panel: Recent Labs  Lab 10/31/23 1048 10/31/23 1850 11/01/23 0436 11/01/23 1628 11/02/23 0557 11/03/23 0357 11/03/23 1315 11/03/23 2116 11/04/23 0429 11/05/23 0452 11/06/23 0520  NA 117*   < > 120*   < > 124* 120* 119* 124* 130* 130* 130*  K 2.7*   < > 3.5   < > 3.3* 3.8 3.9  --  3.7 4.0 4.2  CL 75*   < > 82*   < > 85* 86* 86*  --  93* 96* 96*  CO2 33*   < > 29   < > 30 27 24   --  28 28 27   GLUCOSE 102*   < > 88   < > 107* 104* 146*  --  119* 103* 96  BUN 7*   < > 7*   < > 5* 7* 6*  --  9 13 15   CREATININE 0.51   < > 0.40*   < > 0.42* 0.33* 0.40*  --  0.42* 0.53 0.44  CALCIUM 8.1*   < > 7.8*   < > 8.0* 7.7* 7.9*  --  8.6* 8.2* 8.3*  MG 2.0  --  1.8  --  1.9 1.9  --   --  2.1  --   --   PHOS  --   --  2.3*  --  2.9 2.6  --   --  3.2  --   --    < > = values in this interval not displayed.   Liver Function Tests: Recent Labs  Lab 10/31/23 1048 11/01/23 0436 11/03/23 1315  AST 49* 40 45*  ALT 65* 54* 65*  ALKPHOS 74 61 70  BILITOT  0.8 0.6 0.6  PROT 6.4* 5.4* 5.9*  ALBUMIN 2.6* 2.1* 2.5*   No results for input(s): "LIPASE", "AMYLASE" in the last 168 hours. No results for input(s): "AMMONIA" in the last 168 hours. Cardiac Enzymes: No results for input(s): "CKTOTAL", "CKMB", "CKMBINDEX", "TROPONINI" in the last 168 hours. BNP (last 3 results) No results for input(s): "BNP" in the last 8760 hours. CBG: No results for input(s): "GLUCAP" in the last 168 hours.  Time spent: 35 minutes  Signed:  Gillis Santa  [facility] 11/06/2023 10:26 AM

## 2023-11-06 NOTE — Plan of Care (Signed)
 Rounded on Cassie Taylor, [name] and other family at bedside. Cassie Taylor sitting in recliner, reports restful evening and has been up ambulating this AM. Cassie Taylor shares plan for discharge today-discharge summary in place-[facility] collective referral received for outpatient palliative to follow at discharge.  No Charge.  Leeanne Deed, DNP, AGNP-C Palliative Medicine  Please call Palliative Medicine team phone with any questions [phone]. For individual providers please see AMION.

## 2023-11-06 NOTE — TOC Transition Note (Signed)
 Transition of Care [facility]) - Discharge Note   Patient Details  Name: Cassie Taylor MRN: [id number] Date of Birth: 1941/01/15  Transition of Care [facility]) CM/SW Contact:  Liliana Cline, LCSW Phone Number: 11/06/2023, 10:36 AM   Clinical Narrative:    Patient to DC to [facility] today. Called [facility] and spoke with Eber Jones who confirms patient can return today - just needs FL2 hard copy sent with patient. Eber Jones states they use [facility] for in house HHPT/HHOT. Eber Jones is aware of consult for OP Palliative to follow. Spoke to son Cassie Taylor. Cassie Taylor plans to transport patient back to [facility] today. He is agreeable to [facility] through [facility] to arrange) and [facility] to follow. Cassie Taylor to take hard copy of FL2 with DC med list to give to facility.   Final next level of care: Assisted Living Barriers to Discharge: Barriers Resolved   Patient Goals and CMS Choice   CMS Medicare.gov Compare Post Acute Care list provided to:: Patient Represenative (must comment) Choice offered to / list presented to : Adult Children      Discharge Placement                Patient to be transferred to facility by: son Name of family member notified: Cassie Taylor - son Patient and family notified of of transfer: 11/06/23  Discharge Plan and Services Additional resources added to the After Visit Summary for                            [facility] Arranged: PT, OT HH Agency: Other - See comment ([facility])        Social Drivers of Health (SDOH) Interventions SDOH Screenings   Food Insecurity: No Food Insecurity (11/01/2023)  Housing: Low Risk  (11/01/2023)  Transportation Needs: No Transportation Needs (11/01/2023)  Utilities: Not At Risk (11/01/2023)  Financial Resource Strain: Low Risk  (01/15/2023)   Received from [facility]  Social Connections: Unknown (11/01/2023)  Tobacco Use: Low Risk  (10/31/2023)     Readmission Risk Interventions      No data to display

## 2023-11-06 NOTE — Progress Notes (Signed)
 [facility] Palliative Referral Note  Received a referral for outpatient palliative care services at [facility]- from [facility], SW/TOC.  Patient will discharge today. Referral sent in to Referral Intake.  Thank you for allowing participation in this patient's care.  Norris Cross, RN Nurse Liaison [phone]

## 2023-11-06 NOTE — NC FL2 (Signed)
 [place] MEDICAID FL2 LEVEL OF CARE FORM     IDENTIFICATION  Patient Name: Cassie Taylor Birthdate: [birth year] Sex: female Admission Date (Current Location): 10/31/2023  [facility] and [place] Number:  Chiropodist and Address:  [facility], [address], [place], [place] [address]      Provider Number: [id number]  Attending Physician Name and Address:  Gillis Santa, MD  Relative Name and Phone Number:  Marquisa, Salih (Son)  [phone] [facility])    Current Level of Care: Hospital Recommended Level of Care: Assisted Living Facility Prior Approval Number:    Date Approved/Denied:   PASRR Number: [id number]  Discharge Plan: Home    Current Diagnoses: Patient Active Problem List   Diagnosis Date Noted   Hyponatremia 10/31/2023   Osteopenia 10/31/2023   Hypertension 10/31/2023   Hyperlipidemia 10/31/2023   Glaucoma 10/31/2023   Dementia without behavioral disturbance (HCC) 10/31/2023   SAH (subarachnoid hemorrhage) (HCC) 10/31/2023   CAP (community acquired pneumonia) 10/31/2023   Leukocytosis 10/31/2023   Hypokalemia 10/31/2023    Orientation RESPIRATION BLADDER Height & Weight     Self  Normal Continent Weight: 100 lb 15.5 oz (45.8 kg) Height:  5\' 2"  (157.5 cm)  BEHAVIORAL SYMPTOMS/MOOD NEUROLOGICAL BOWEL NUTRITION STATUS  Other (Comment) (n/a)  (n/a) Continent Diet (reg)  AMBULATORY STATUS COMMUNICATION OF NEEDS Skin   Limited Assist Verbally Other (Comment) (redness)                       Personal Care Assistance Level of Assistance  Bathing, Feeding, Dressing Bathing Assistance: Limited assistance Feeding assistance: Limited assistance Dressing Assistance: Limited assistance     Functional Limitations Info  Sight Sight Info: Adequate        SPECIAL CARE FACTORS FREQUENCY  PT (By licensed PT), OT (By licensed OT)     PT Frequency: home health at ALF OT Frequency: home health            Contractures  Contractures Info: Not present    Additional Factors Info  Code Status, Allergies Code Status Info: Limited: Do not attempt resuscitation (DNR) -DNR-LIMITED -Do Not Intubate/DNI Allergies Info: Codeine            Discharge Medications: Medication List       PAUSE taking these medications     citalopram 20 MG tablet Wait to take this until: November 12, 2023 Commonly known as: CELEXA Take 20 mg by mouth daily.           STOP taking these medications     Aspirin 81 MG Caps    Calcium Citrate Plus/Magnesium Tabs    CALCIUM CITRATE PO    cefdinir 300 MG capsule Commonly known as: OMNICEF    cephALEXin 500 MG capsule Commonly known as: KEFLEX    diltiazem 240 MG 24 hr capsule Commonly known as: TIAZAC    hydrochlorothiazide 12.5 MG tablet Commonly known as: HYDRODIURIL    ibuprofen 400 MG tablet Commonly known as: ADVIL    meclizine 25 MG tablet Commonly known as: ANTIVERT           TAKE these medications     acetaminophen 325 MG tablet Commonly known as: TYLENOL Take 2 tablets (650 mg total) by mouth every 6 (six) hours as needed for mild pain (pain score 1-3), fever, headache or moderate pain (pain score 4-6) (or Fever >/= 101).    alendronate 70 MG tablet Commonly known as: FOSAMAX Take 70 mg by mouth once  a week.    atorvastatin 10 MG tablet Commonly known as: LIPITOR Take 10 mg by mouth daily.    Calcium-Magnesium-Vitamin D 500-250-125 MG-MG-UNIT Tabs Take 1 tablet by mouth daily.    diltiazem 240 MG 24 hr capsule Commonly known as: DILACOR XR Take 240 mg by mouth daily.    feeding supplement Liqd Take 237 mLs by mouth 2 (two) times daily between meals.    Iron 325 (65 Fe) MG Tabs Take by mouth daily.    latanoprost 0.005 % ophthalmic solution Commonly known as: XALATAN Place 1 drop into both eyes at bedtime.    lisinopril 5 MG tablet Commonly known as: ZESTRIL Take 5 mg by mouth daily.    Melatonin 10 MG Tabs Take 10 mg by mouth  at bedtime.    multivitamin tablet Take 1 tablet by mouth daily.    Omega-3 1000 MG Caps Take 2,000 mg by mouth daily.    predniSONE 5 MG tablet Commonly known as: DELTASONE Take 5 mg by mouth daily.    QUEtiapine 25 MG tablet Commonly known as: SEROQUEL Take 1 tablet (25 mg total) by mouth daily after supper.    Timolol Maleate (Once-Daily) 0.5 % Soln Place 1 drop into both eyes in the morning and at bedtime.      Relevant Imaging Results:  Relevant Lab Results:   Additional Information SS #: [id number]  Shawndra Clute E Viet Kemmerer, LCSW

## 2023-11-06 NOTE — Plan of Care (Signed)

## 2023-11-06 NOTE — Progress Notes (Signed)
 Mobility Specialist - Progress Note   11/06/23 0900  Mobility  Activity Ambulated with assistance in hallway;Stood at bedside;Dangled on edge of bed  Level of Assistance Standby assist, set-up cues, supervision of patient - no hands on  Assistive Device Front wheel walker  Distance Ambulated (ft) 80 ft  Activity Response Tolerated well  Mobility Referral Yes  Mobility visit 1 Mobility  Mobility Specialist Start Time (ACUTE ONLY) 0908  Mobility Specialist Stop Time (ACUTE ONLY) 0931  Mobility Specialist Time Calculation (min) (ACUTE ONLY) 23 min   Pt semi-supine in bed on RA upon arrival. Pt completes bed mobility MinA. Pt STS and ambulates in hallway SBA. Pt returns to bed with needs in reach, bed alarm activated, and family present.  Terrilyn Saver  Mobility Specialist  11/06/23 9:49 AM

## 2024-05-08 NOTE — H&P (View-Only) (Signed)
 Podiatry Clinic History and Physical   Cassie Taylor is a 83 y.o. femalewith PMH frontal lobe dementia, hypertension, hyperlipidemia, chronic hyponatremia  for that is presenting to clinic today with her daughter in law (DPOA) presenting today for pre-operative evaluation to address condition/dx of:   Encounter Diagnoses  Name Primary?  . Painful orthopaedic hardware () Yes  . Acute foot pain, left   . Visit for pre-operative examination    BACKGROUND: Patient is residing in nursing home - a provider comes in monthly to assist with nail trimming Last Thursday family was contacted by provider re: lesion to the L medial first metatarsal head. Family unclear of when this arose but assume it is within the last month as the provider did not note that before. There have been falls in interim. She has serosanguinous drainage from site. Has h/o bunionectomy to the area 10+ years ago - no prior in incident of wound/ drainage / opening.  Denies F/C/N/V/SOB/CP/Calf pain.    Based on the conservative treatment attempted, R/B/A held at prior appointment, it was agreed upon by patient and provider to undergo the surgical intervention of:    - Health status has not changed from the prior encounter.  - Lower extremity concerns as identified previously have not changed from the prior encounter.  No other acute pedal complaints at this time.    Past Medical History:  Diagnosis Date  . Cataract cortical, senile    surgery 07/20/12  . Glaucoma   . Hyperlipidemia   . Hypertension   . Osteopenia      Your Medication List       * Accurate as of May 09, 2024 12:58 PM. Always use your most recent med list.            Instructions 8 am 12 pm 5 pm 8 pm Notes  alendronate 70 MG tablet Commonly known as: FOSAMAX  Take 1 tablet (70 mg total) by mouth every 7 (seven) days Take with a full glass of water. Do not lie down for the next 30 min.             atorvastatin  10 MG tablet Commonly  known as: LIPITOR  Take 1 tablet (10 mg total) by mouth once daily             calcium -mag-vit B6-D3-minerals 250-40-5-125 mg-mg-mg-unit Tab  Take by mouth.             divalproex 125 mg DR capsule Commonly known as: DEPAKOTE SPRINKLE              ferrous sulfate 325 (65 FE) MG EC tablet  Take 325 mg by mouth daily with breakfast             latanoprost  0.005 % ophthalmic solution Commonly known as: XALATAN   Place 1 drop into both eyes nightly.             lisinopriL  5 MG tablet Commonly known as: ZESTRIL   Take 1 tablet by mouth once daily             melatonin 10 mg Cap  Take 10 mg by mouth             multivitamin capsule  Take 1 tablet by mouth once daily             omega-3-dha-epa-fish oil 300-1,000 mg capsule  Take 2 g by mouth daily.             predniSONE  5 MG tablet  Commonly known as: DELTASONE   Take 5 mg by mouth once daily             QUEtiapine  25 MG tablet Commonly known as: SEROquel               timoloL  maleate 0.25 % ophthalmic solution Commonly known as: TIMOPTIC   1 drop 2 (two) times daily.                 Allergies  Allergen Reactions  . Codeine Unknown and Nausea And Vomiting    Dizziness  . Hydrochlorothiazide Other (See Comments)    Hyponatremia.   Past Surgical History:  Procedure Laterality Date  . BUNION CORRECTION Left   . CATARACT EXTRACTION        PHYSICAL EXAMINATION  BP 124/76   Ht 152.4 cm (5')   Wt 46.3 kg (102 lb)   BMI 19.92 kg/m  Constitutional: Patient appears of normal development and good nutritional status for stated age, well-groomed, and of normal body habitus. Phonating appropriately.  Lymphatic: On examination of both lower extremities, there is no lymphadenopathy, lymphangitis, or lymphedema on either the right or the left feet or legs.  Psychiatric: Alert and oriented to time, place, person and situation. The patient is noted to have good judgment and  insight into the reason for today's visit and treatments.   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 No crepitus.   Dermatological:  Skin quality: atrophic  No ulcerations, open wounds noted bilaterally.  Interdigital spaces: clean, dry, and intact Hyperkeratotic  focal  tissue noted (with centralized lesion) no active drainage or fluctuance, but noted to prominence underneath skin. Central lesion with serosanguinous drainage - noted on sock again noted today with mild maceration surrounding lesion 05/09/24   3 Views taken and reviewed of the Left FOOT: AP, Lat, Oblique   Soft Tissue Density: WNL Bone Density: WNL Fracture: None  There is previous hardware noted at the first MPJ for prior fusion. There is some remaining line that may indicate incomplete fusion. There is a 4 hole plate with two screw anchors in the most proximal and distal holes - two crossing screws across the joint - partially threaded 4.0 screws that appear to Hex. The more proximal crossing screw head correlates to the medial clinical lesion of skin suggestive of back out of this screw.   Impression: Screw from proximal medial to distal lateral appears to have back out with prominent head and no evidence of soft tissue envelop penetration. No e/o abscess or deeper seated infection.   This  impression was discussed/ shared with the patient.  ASSESSMENT/ PLAN:   Encounter Diagnoses  Name Primary?  . Painful orthopaedic hardware () Yes  . Acute foot pain, left   . Visit for pre-operative examination      I have determined the complexity of the problem and Condition(s), etiologies, and options for care, treatment plan,  and prognosis were reviewed with the patient. Condition, etiologies, and options for care, treatment plan, and prognosis have all been reviewed with the patient. Conservative and surgical options for care have been discussed and patient was advised of the different treatment options available. R/B/A discussion held. All questions were answered in detail to patient satisfaction.  Given the inability for conservative treatment to adequately address the patients condition, patient has opted to discuss surgical intervention.  Patient educated about continued conservative care vs. surgical intervention, and the patient has elected for surgical management of this condition (hardware removal). Patient has been advised of the approximate disability period involved for these procedures. In addition, the patient has been advised as to the alternatives of care including but not limited to; continued conservative care as well as surgical procedures (complete hardware removal versus partial - solely irritating screw).  The patient verbalized their understanding of the Moderate risks and complications that could occur, including to but not limited to: numbness, nerve-related issues (i.e. transient neuritis vs. CRPS), pain (acute & chronic), injury to adjacent tissues / vessels / structures, infection, edema, DVT, scar formation, the possible need for additional surgical intervention, as well as potential anesthesia related complications.  As the prior hardware was placed many years ago it is likely that the fusion was successful, however removal of the screws - if fall had occurred and  fractured the site - the other hardware may become problematic at this time - patient and caregiver aware.   Below details some of the information specific to this case that has been discussed and reviewed by both the patient and the provider:  - Patient with lesion and radiographic imaging suggestive of acute hardware failure and back out along with overlying skin lesion / beginning skin breakdown.   [x]  Planned procedure: Hardware removal, left foot  [x]  Imaging Obtained:  Date: 04/25/2024  Type: XR L foot  [x]  Planned WB course: minimize activity x 2 weeks until suture removal - then WBAT in regular supportive shoe gear.  [x]  Additional medical clearance needed:  (contacted PCP Dr. Vertell -who believes the patient is stable for operative clearance - forms faxed)  [x]  Labs: per anesthesia/ PCP (contacted PCP Dr. Vertell -who believes the patient is stable for operative clearance - forms faxed)  [x]  Surgical Consent: Both daughter-in-law (DPOA) and patient signed consent forms today which were reviewed in total.   []  POST-OP PREPPING:  - WB apparatus dispensed: no   POST OP  - Needs an appointment at 1 week and 2 weeks post op.   Medications sent to be picked up in order for patient to have at facility post-operatively. Patient and caregiver aware.  - Pain Medication: Ibuprofen and APAP. Okay to alternate. Rx for 5 days. Anticipated may need for 3.  - ABX - Will also recommend short course of ABX for SSTI coverage given skin has broken. Patient to begin taking post operatively.   The patient / caregiver was given the opportunity to ask questions, which were answered to the best of my ability. The patient is to call if any further questions arise. *Some images could not be shown.

## 2024-05-08 NOTE — Progress Notes (Addendum)
 Podiatry Clinic History and Physical   Cassie Taylor is a 83 y.o. femalewith PMH frontal lobe dementia, hypertension, hyperlipidemia, chronic hyponatremia  for that is presenting to clinic today with her daughter in law (DPOA) presenting today for pre-operative evaluation to address condition/dx of:   Encounter Diagnoses  Name Primary?  . Painful orthopaedic hardware () Yes  . Acute foot pain, left   . Visit for pre-operative examination    BACKGROUND: Patient is residing in nursing home - a provider comes in monthly to assist with nail trimming Last Thursday family was contacted by provider re: lesion to the L medial first metatarsal head. Family unclear of when this arose but assume it is within the last month as the provider did not note that before. There have been falls in interim. She has serosanguinous drainage from site. Has h/o bunionectomy to the area 10+ years ago - no prior in incident of wound/ drainage / opening.  Denies F/C/N/V/SOB/CP/Calf pain.    Based on the conservative treatment attempted, R/B/A held at prior appointment, it was agreed upon by patient and provider to undergo the surgical intervention of:    - Health status has not changed from the prior encounter.  - Lower extremity concerns as identified previously have not changed from the prior encounter.  No other acute pedal complaints at this time.    Past Medical History:  Diagnosis Date  . Cataract cortical, senile    surgery 07/20/12  . Glaucoma   . Hyperlipidemia   . Hypertension   . Osteopenia      Your Medication List       * Accurate as of May 09, 2024 12:58 PM. Always use your most recent med list.            Instructions 8 am 12 pm 5 pm 8 pm Notes  alendronate 70 MG tablet Commonly known as: FOSAMAX  Take 1 tablet (70 mg total) by mouth every 7 (seven) days Take with a full glass of water. Do not lie down for the next 30 min.             atorvastatin  10 MG tablet Commonly  known as: LIPITOR  Take 1 tablet (10 mg total) by mouth once daily             calcium -mag-vit B6-D3-minerals 250-40-5-125 mg-mg-mg-unit Tab  Take by mouth.             divalproex 125 mg DR capsule Commonly known as: DEPAKOTE SPRINKLE              ferrous sulfate 325 (65 FE) MG EC tablet  Take 325 mg by mouth daily with breakfast             latanoprost  0.005 % ophthalmic solution Commonly known as: XALATAN   Place 1 drop into both eyes nightly.             lisinopriL  5 MG tablet Commonly known as: ZESTRIL   Take 1 tablet by mouth once daily             melatonin 10 mg Cap  Take 10 mg by mouth             multivitamin capsule  Take 1 tablet by mouth once daily             omega-3-dha-epa-fish oil 300-1,000 mg capsule  Take 2 g by mouth daily.             predniSONE  5 MG tablet  Commonly known as: DELTASONE   Take 5 mg by mouth once daily             QUEtiapine  25 MG tablet Commonly known as: SEROquel               timoloL  maleate 0.25 % ophthalmic solution Commonly known as: TIMOPTIC   1 drop 2 (two) times daily.                 Allergies  Allergen Reactions  . Codeine Unknown and Nausea And Vomiting    Dizziness  . Hydrochlorothiazide Other (See Comments)    Hyponatremia.   Past Surgical History:  Procedure Laterality Date  . BUNION CORRECTION Left   . CATARACT EXTRACTION        PHYSICAL EXAMINATION  BP 124/76   Ht 152.4 cm (5')   Wt 46.3 kg (102 lb)   BMI 19.92 kg/m   GENERAL: Awake, conversational. No acute distress.   HENT: Atraumatic, mucous membranes moist, oropharynx clear, no thyromegaly.   EYES: EOMI, PERRLA.   PULMONARY: Breathing easily on room air, no respiratory distress.   NECK: Normal ROM, supple. No C-spine tenderness to palpation.   CARDIAC: Normal rate. Radial pulses 2+ and symmetric bilaterally.   ABDOMEN: Soft, nondistended, no tenderness to palpation, no guarding.    BACK: No midline tenderness, no CVA tenderness.   EXTREMITIES: Moving all extremities, LE focused exam as below.   SKIN: Warm, dry, no rashes. LE as noted below.   NEURO: A&Ox3, speech clear, symmetrical face, cranial nerves intact, distal strength 5/5 in b/l UE.  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 No crepitus.   Dermatological:  Skin quality: atrophic  No ulcerations, open wounds noted bilaterally.  Interdigital spaces: clean, dry, and intact Hyperkeratotic  focal  tissue noted (with centralized lesion) no active drainage or fluctuance, but noted to prominence underneath skin. Central lesion with serosanguinous drainage - noted on sock again noted today with mild maceration surrounding lesion 05/09/24   3 Views taken and reviewed of the Left FOOT: AP, Lat, Oblique   Soft Tissue Density: WNL Bone Density: WNL Fracture: None  There is previous hardware noted at the first MPJ for prior fusion. There is some remaining line that may indicate incomplete fusion. There is a 4 hole plate with two screw anchors in the most proximal and distal holes - two crossing screws across the joint - partially threaded 4.0 screws that appear to Hex. The more proximal crossing screw head correlates to the medial clinical lesion of skin suggestive of back out of this screw.    Impression: Screw from proximal medial to distal lateral appears to have back out with prominent head and no evidence of soft tissue envelop penetration. No e/o abscess or deeper seated infection.   This impression was discussed/ shared with the patient.  ASSESSMENT/ PLAN:   Encounter Diagnoses  Name Primary?  . Painful  orthopaedic hardware () Yes  . Acute foot pain, left   . Visit for pre-operative examination      I have determined the complexity of the problem and Condition(s), etiologies, and options for care, treatment plan, and prognosis were reviewed with the patient. Condition, etiologies, and options for care, treatment plan, and prognosis have all been reviewed with the patient. Conservative and surgical options for care have been discussed and patient was advised of the different treatment options available. R/B/A discussion held. All questions were answered in detail to patient satisfaction.  Given the inability for conservative treatment to adequately address the patients condition, patient has opted to discuss surgical intervention.  Patient educated about continued conservative care vs. surgical intervention, and the patient has elected for surgical management of this condition (hardware removal). Patient has been advised of the approximate disability period involved for these procedures. In addition, the patient has been advised as to the alternatives of care including but not limited to; continued conservative care as well as surgical procedures (complete hardware removal versus partial - solely irritating screw).  The patient verbalized their understanding of the Moderate risks and complications that could occur, including to but not limited to: numbness, nerve-related issues (i.e. transient neuritis vs. CRPS), pain (acute & chronic), injury to adjacent tissues / vessels / structures, infection, edema, DVT, scar formation, the possible need for additional surgical  intervention, as well as potential anesthesia related complications.  As the prior hardware was placed many years ago it is likely that the fusion was successful, however removal of the screws - if fall had occurred and fractured the site - the other hardware may become problematic at this time - patient and caregiver aware.   Below details some of the information specific to this case that has been discussed and reviewed by both the patient and the provider:  - Patient with lesion and radiographic imaging suggestive of acute hardware failure and back out along with overlying skin lesion / beginning skin breakdown.   [x]  Planned procedure: Hardware removal, left foot  [x]  Imaging Obtained:  Date: 04/25/2024  Type: XR L foot  [x]  Planned WB course: minimize activity x 2 weeks until suture removal - then WBAT in regular supportive shoe gear.  [x]  Additional medical clearance needed:  (contacted PCP Dr. Vertell -who believes the patient is stable for operative clearance - forms faxed)  [x]  Labs: per anesthesia/ PCP (contacted PCP Dr. Vertell -who believes the patient is stable for operative clearance - forms faxed)  [x]  Surgical Consent: Both daughter-in-law (DPOA) and patient signed consent forms today which were reviewed in total.   []  POST-OP PREPPING:  - WB apparatus dispensed: no   POST OP  - Needs an appointment at 1 week and 2 weeks post op.   Medications sent to be picked up in order for patient to have at facility post-operatively. Patient and caregiver aware.  - Pain Medication: Ibuprofen and APAP. Okay to alternate. Rx for 5 days. Anticipated may need for 3.  - ABX - Will also recommend short course of ABX for SSTI coverage given skin has broken. Patient to begin taking post operatively.   The patient / caregiver was given the opportunity to ask questions, which were answered to the best of my ability. The patient is to call if any further questions arise. *Some images could not be shown.

## 2024-05-09 ENCOUNTER — Other Ambulatory Visit: Payer: Self-pay

## 2024-05-10 ENCOUNTER — Other Ambulatory Visit: Payer: Self-pay

## 2024-05-10 NOTE — Anesthesia Preprocedure Evaluation (Signed)
 Anesthesia Evaluation  Patient identified by MRN, date of birth, ID band Patient awake    Reviewed: Allergy & Precautions, H&P , NPO status , Patient's Chart, lab work & pertinent test results  Airway Mallampati: II  TM Distance: >3 FB Neck ROM: Full    Dental no notable dental hx.  Lower permanent bridge:   Pulmonary neg pulmonary ROS, pneumonia   Pulmonary exam normal breath sounds clear to auscultation       Cardiovascular hypertension, Normal cardiovascular exam Rhythm:Regular Rate:Normal     Neuro/Psych  PSYCHIATRIC DISORDERS     Dementia negative neurological ROS  negative psych ROS   GI/Hepatic negative GI ROS, Neg liver ROS,,,  Endo/Other  negative endocrine ROS    Renal/GU negative Renal ROS  negative genitourinary   Musculoskeletal negative musculoskeletal ROS (+) Arthritis ,    Abdominal   Peds negative pediatric ROS (+)  Hematology negative hematology ROS (+)   Anesthesia Other Findings Hypertension  Motion sickness Benign head tremor  Scoliosis Hyperlipidemia  Cancer (HCC) Arthritis  Dementia (HCC)    Reproductive/Obstetrics negative OB ROS                              Anesthesia Physical Anesthesia Plan  ASA: 2  Anesthesia Plan: MAC   Post-op Pain Management:    Induction: Intravenous  PONV Risk Score and Plan:   Airway Management Planned: Natural Airway and Nasal Cannula  Additional Equipment:   Intra-op Plan:   Post-operative Plan:   Informed Consent: I have reviewed the patients History and Physical, chart, labs and discussed the procedure including the risks, benefits and alternatives for the proposed anesthesia with the patient or authorized representative who has indicated his/her understanding and acceptance.     Dental Advisory Given  Plan Discussed with: Anesthesiologist, CRNA and Surgeon  Anesthesia Plan Comments: (Patient consented for  risks of anesthesia including but not limited to:  - adverse reactions to medications - damage to eyes, teeth, lips or other oral mucosa - nerve damage due to positioning  - sore throat or hoarseness - Damage to heart, brain, nerves, lungs, other parts of body or loss of life  Patient voiced understanding and assent.)         Anesthesia Quick Evaluation

## 2024-05-11 ENCOUNTER — Encounter: Payer: Self-pay | Admitting: Anesthesiology

## 2024-05-11 ENCOUNTER — Ambulatory Visit: Admission: RE | Admit: 2024-05-11 | Discharge: 2024-05-11 | Disposition: A | Discharge status: Home / Self Care

## 2024-05-11 ENCOUNTER — Other Ambulatory Visit: Payer: Self-pay

## 2024-05-11 ENCOUNTER — Encounter: Admission: RE | Disposition: A | Discharge status: Home / Self Care | Payer: Self-pay | Source: Home / Self Care

## 2024-05-11 DIAGNOSIS — Z539 Procedure and treatment not carried out, unspecified reason: Secondary | ICD-10-CM | POA: Diagnosis not present

## 2024-05-11 DIAGNOSIS — X58XXXA Exposure to other specified factors, initial encounter: Secondary | ICD-10-CM | POA: Insufficient documentation

## 2024-05-11 DIAGNOSIS — M79672 Pain in left foot: Secondary | ICD-10-CM | POA: Insufficient documentation

## 2024-05-11 DIAGNOSIS — T8484XA Pain due to internal orthopedic prosthetic devices, implants and grafts, initial encounter: Secondary | ICD-10-CM | POA: Insufficient documentation

## 2024-05-11 HISTORY — DX: Hypo-osmolality and hyponatremia: E87.1

## 2024-05-11 HISTORY — DX: Unspecified dementia, unspecified severity, without behavioral disturbance, psychotic disturbance, mood disturbance, and anxiety: F03.90

## 2024-05-11 HISTORY — DX: Unspecified osteoarthritis, unspecified site: M19.90

## 2024-05-11 HISTORY — DX: Malignant (primary) neoplasm, unspecified: C80.1

## 2024-05-11 LAB — BASIC METABOLIC PANEL WITH GFR
Anion gap: 10 (ref 5–15)
BUN: 14 mg/dL (ref 8–23)
CO2: 25 mmol/L (ref 22–32)
Calcium: 8.7 mg/dL — ABNORMAL LOW (ref 8.9–10.3)
Chloride: 93 mmol/L — ABNORMAL LOW (ref 98–111)
Creatinine, Ser: 0.68 mg/dL (ref 0.44–1.00)
GFR, Estimated: >60 mL/min (ref >60)
Glucose, Bld: 108 mg/dL — ABNORMAL HIGH (ref 70–99)
Potassium: 3.9 mmol/L (ref 3.5–5.1)
Sodium: 128 mmol/L — ABNORMAL LOW (ref 135–145)

## 2024-05-11 SURGERY — REMOVAL, HARDWARE
Anesthesia: Monitor Anesthesia Care | Laterality: Left

## 2024-05-11 MED ORDER — LACTATED RINGERS IV SOLN
INTRAVENOUS | Status: DC
Start: 2024-05-11 — End: 2024-05-11

## 2024-05-11 MED ORDER — CEFAZOLIN SODIUM-DEXTROSE 2-3 GM-%(50ML) IV SOLR
INTRAVENOUS | Status: AC
  Filled 2024-05-11: qty 50

## 2024-05-11 MED ORDER — SODIUM CHLORIDE 0.9 % IV SOLN: INTRAVENOUS | Status: DC

## 2024-05-11 MED ORDER — FENTANYL CITRATE (PF) 100 MCG/2ML IJ SOLN
INTRAMUSCULAR | Status: AC
  Filled 2024-05-11: qty 2

## 2024-05-11 MED ORDER — CEFAZOLIN SODIUM-DEXTROSE 2-4 GM/100ML-% IV SOLN: 2.0000 g | INTRAVENOUS | Status: DC

## 2024-05-11 MED ORDER — PROPOFOL 1000 MG/100ML IV EMUL
INTRAVENOUS | Status: AC
  Filled 2024-05-11: qty 100

## 2024-05-11 SURGICAL SUPPLY — 10 items
BNDG COHESIVE 4X5 TAN STRL LF (GAUZE/BANDAGES/DRESSINGS) IMPLANT
BNDG ESMARCH 4X12 STRL LF (GAUZE/BANDAGES/DRESSINGS) IMPLANT
DRAPE FLUOR MINI C-ARM 54X84 (DRAPES) IMPLANT
GLOVE BIOGEL PI IND STRL 7.5 (GLOVE) IMPLANT
GOWN STRL REUS W/ TWL LRG LVL3 (GOWN DISPOSABLE) IMPLANT
KIT TURNOVER KIT A (KITS) IMPLANT
NS IRRIG 500ML POUR BTL (IV SOLUTION) IMPLANT
PACK EXTREMITY ARMC (MISCELLANEOUS) IMPLANT
PENCIL SMOKE EVACUATOR (MISCELLANEOUS) IMPLANT
STOCKINETTE IMPERVIOUS LG (DRAPES) IMPLANT

## 2024-05-11 NOTE — Progress Notes (Signed)
 Procedure cancelled. Patient has abnormal lab values and it would not be safe to proceed with anesthesia today. Pt verbalized understanding. Pt discharge home.

## 2024-05-11 NOTE — Interval H&P Note (Signed)
 History and Physical Interval Note:  05/11/2024 7:44 AM  Cassie Taylor  has presented today for surgery, with the diagnosis of Painful orthopaedic hardware T84.84XA Left foot pain M79.672.  The various methods of treatment have been discussed with the patient and family. After consideration of risks, benefits and other options for treatment, the patient has consented to  Procedure(s): REMOVAL, HARDWARE (Left) as a surgical intervention.  The patient's history has been reviewed, patient examined, no change in status, stable for surgery.  I have reviewed the patient's chart and labs.  Questions were answered to the patient's satisfaction.     Jacarie Pate [name]

## 2024-05-11 NOTE — Discharge Instructions (Addendum)
 Podiatry (Foot and Ankle) Hospital Discharge Instructions:  STATUS UPDATE: Your surgery has been DELAYED.  Due to hyponatremia (level 128) your surgery has been delayed for your safety. We have gotten in contact with your PCP re: next steps and how to safely correct your Na (sodium) levels. Once your sodium meets threshold for anesthesia, we are able to reschedule acutely.   *SHOULD you have break through of skin or hardware exposed, you are instructed to CALL OFFICE IMMEDIATELY.  In interim you can follow the instructions below.   WOUND CARE / DRESSINGS:  - Instructions: DAILY wound inspection / instruction.  - Instruction: Apply betadine / iodine / or povidine solution to the area. Permit to air dry. Cover area with bandaid while in shoes. OKAY to remove bandaid at night to permit site to air dry. In addition, please do betadine / iodine/ or povidine instruction any time the site gets wet (I.e. after shower) and permit to air dry.  - No scrubbing to the area.  - No soaking the foot OR having prolonged exposure to water > 5 minutes.     ANTIBIOTICS: No antibiotics are deemed necessary for today 05/11/2024 - however if site opens, please call office.   FOLLOW UP: Our office will reach out with further steps once we hear back from your primary care office. Please feel free to contact Dr. Vertell as well (I have already).   If there are any questions or issues in reference to your lower extremity care that arise in interim of your next appointment, please call the clinic for assistance.    [facility]: [address] [place], [place] [address] Phone: [phone] Hours:Mon - Friday: 8:00am - 5:00pm

## 2024-05-11 NOTE — Progress Notes (Signed)
 Sodium 128, will reschedule. Dr. Tanda to discuss with primary care physician

## 2024-06-28 ENCOUNTER — Other Ambulatory Visit: Payer: Self-pay

## 2024-06-28 NOTE — Progress Notes (Signed)
 05-11-24 case was cancelled due to hyponatremia. Patient to see primary care for normalization of sodium

## 2024-07-03 NOTE — Anesthesia Preprocedure Evaluation (Addendum)
 Anesthesia Evaluation  Patient identified by MRN, date of birth, ID band Patient awake    Reviewed: Allergy & Precautions, H&P , NPO status , Patient's Chart, lab work & pertinent test results  Airway Mallampati: II  TM Distance: <3 FB Neck ROM: Full    Dental   Lower permanent bridge  :   Pulmonary neg pulmonary ROS, pneumonia   Pulmonary exam normal breath sounds clear to auscultation       Cardiovascular hypertension, Normal cardiovascular exam Rhythm:Regular Rate:Normal     Neuro/Psych  PSYCHIATRIC DISORDERS     Dementia negative neurological ROS  negative psych ROS   GI/Hepatic negative GI ROS, Neg liver ROS,,,  Endo/Other  negative endocrine ROS    Renal/GU negative Renal ROS  negative genitourinary   Musculoskeletal negative musculoskeletal ROS (+) Arthritis ,    Abdominal   Peds negative pediatric ROS (+)  Hematology negative hematology ROS (+)   Anesthesia Other Findings BMP day before surgery for hx chronic hyponatremia, Na 131, will proceed with surgery  Hypertension             Motion sickness Benign head tremor     Scoliosis Hyperlipidemia             Cancer  Arthritis             Fronto-Dementia  Patient does not recall what reaction she has to codeine, and daughter was not aware of this concern, but Epic has nausea/vomiting as a side effect of codeine  Reproductive/Obstetrics negative OB ROS                              Anesthesia Physical Anesthesia Plan  ASA: 3  Anesthesia Plan: General   Post-op Pain Management:    Induction: Intravenous  PONV Risk Score and Plan:   Airway Management Planned: Natural Airway and Nasal Cannula  Additional Equipment:   Intra-op Plan:   Post-operative Plan: Extubation in OR  Informed Consent: I have reviewed the patients History and Physical, chart, labs and discussed the procedure including the risks, benefits and  alternatives for the proposed anesthesia with the patient or authorized representative who has indicated his/her understanding and acceptance.     Dental Advisory Given  Plan Discussed with: Anesthesiologist, CRNA and Surgeon  Anesthesia Plan Comments: (Patient consented for risks of anesthesia including but not limited to:  - adverse reactions to medications - risk of airway placement if required - damage to eyes, teeth, lips or other oral mucosa - nerve damage due to positioning  - sore throat or hoarseness - Damage to heart, brain, nerves, lungs, other parts of body or loss of life  Patient voiced understanding and assent.)         Anesthesia Quick Evaluation

## 2024-07-05 ENCOUNTER — Other Ambulatory Visit: Payer: Self-pay

## 2024-07-05 NOTE — Discharge Instructions (Signed)

## 2024-07-06 ENCOUNTER — Ambulatory Visit: Admission: RE | Admit: 2024-07-06 | Discharge: 2024-07-06 | Disposition: A

## 2024-07-06 ENCOUNTER — Other Ambulatory Visit: Payer: Self-pay

## 2024-07-06 ENCOUNTER — Encounter: Admission: RE | Disposition: A | Payer: Self-pay | Source: Home / Self Care

## 2024-07-06 ENCOUNTER — Ambulatory Visit: Payer: Self-pay | Admitting: Anesthesiology

## 2024-07-06 DIAGNOSIS — T8484XA Pain due to internal orthopedic prosthetic devices, implants and grafts, initial encounter: Secondary | ICD-10-CM

## 2024-07-06 DIAGNOSIS — Z01818 Encounter for other preprocedural examination: Secondary | ICD-10-CM

## 2024-07-06 HISTORY — DX: Dementia in other diseases classified elsewhere, unspecified severity, without behavioral disturbance, psychotic disturbance, mood disturbance, and anxiety: F02.80

## 2024-07-06 HISTORY — PX: HARDWARE REMOVAL: SHX979

## 2024-07-06 SURGERY — REMOVAL, HARDWARE
Anesthesia: General | Laterality: Left

## 2024-07-06 MED ORDER — CEFAZOLIN SODIUM-DEXTROSE 2-3 GM-%(50ML) IV SOLR
INTRAVENOUS | Status: AC
  Filled 2024-07-06: qty 50

## 2024-07-06 MED ORDER — PHENYLEPHRINE 80 MCG/ML (10ML) SYRINGE FOR IV PUSH (FOR BLOOD PRESSURE SUPPORT)
PREFILLED_SYRINGE | INTRAVENOUS | Status: AC
  Filled 2024-07-06: qty 10

## 2024-07-06 MED ORDER — OXYCODONE HCL 5 MG PO TABS: 10.0000 mg | ORAL_TABLET | Freq: Once | ORAL | Status: DC

## 2024-07-06 MED ORDER — FENTANYL CITRATE (PF) 100 MCG/2ML IJ SOLN
INTRAMUSCULAR | Status: AC
  Filled 2024-07-06: qty 2

## 2024-07-06 MED ORDER — BUPIVACAINE LIPOSOME 1.3 % IJ SUSP
INTRAMUSCULAR | Status: DC | PRN
  Administered 2024-07-06: 5 mL

## 2024-07-06 MED ORDER — EPHEDRINE SULFATE (PRESSORS) 25 MG/5ML IV SOSY
PREFILLED_SYRINGE | INTRAVENOUS | Status: DC | PRN
  Administered 2024-07-06 (×2): 5 mg via INTRAVENOUS

## 2024-07-06 MED ORDER — PHENYLEPHRINE HCL (PRESSORS) 10 MG/ML IV SOLN
INTRAVENOUS | Status: DC | PRN
  Administered 2024-07-06: 120 ug via INTRAVENOUS
  Administered 2024-07-06: 160 ug via INTRAVENOUS
  Administered 2024-07-06 (×2): 80 ug via INTRAVENOUS

## 2024-07-06 MED ORDER — LIDOCAINE HCL (CARDIAC) PF 100 MG/5ML IV SOSY
PREFILLED_SYRINGE | INTRAVENOUS | Status: DC | PRN
  Administered 2024-07-06: 100 mg via INTRATRACHEAL

## 2024-07-06 MED ORDER — BUPIVACAINE HCL (PF) 0.5 % IJ SOLN
INTRAMUSCULAR | Status: DC | PRN
  Administered 2024-07-06: 5 mL

## 2024-07-06 MED ORDER — LIDOCAINE HCL (PF) 2 % IJ SOLN
INTRAMUSCULAR | Status: AC
  Filled 2024-07-06: qty 5

## 2024-07-06 MED ORDER — FENTANYL CITRATE (PF) 100 MCG/2ML IJ SOLN
INTRAMUSCULAR | Status: DC | PRN
  Administered 2024-07-06: 25 ug via INTRAVENOUS

## 2024-07-06 MED ORDER — LACTATED RINGERS IV SOLN: INTRAVENOUS | Status: DC

## 2024-07-06 MED ORDER — CEFAZOLIN SODIUM-DEXTROSE 2-4 GM/100ML-% IV SOLN
2.0000 g | INTRAVENOUS | Status: AC
  Administered 2024-07-06: 2 g via INTRAVENOUS

## 2024-07-06 MED ORDER — SEVOFLURANE IN SOLN
RESPIRATORY_TRACT | Status: AC
  Filled 2024-07-06: qty 250

## 2024-07-06 MED ORDER — PROPOFOL 10 MG/ML IV BOLUS
INTRAVENOUS | Status: DC | PRN
  Administered 2024-07-06: 160 mg via INTRAVENOUS

## 2024-07-06 MED ORDER — 0.9 % SODIUM CHLORIDE (POUR BTL) OPTIME
TOPICAL | Status: DC | PRN
  Administered 2024-07-06: 1000 mL

## 2024-07-06 MED ORDER — ONDANSETRON HCL 4 MG/2ML IJ SOLN
INTRAMUSCULAR | Status: DC | PRN
  Administered 2024-07-06: 4 mg via INTRAVENOUS

## 2024-07-06 MED ORDER — BUPIVACAINE LIPOSOME 1.3 % IJ SUSP
INTRAMUSCULAR | Status: AC
  Filled 2024-07-06: qty 10

## 2024-07-06 MED ORDER — BUPIVACAINE HCL (PF) 0.5 % IJ SOLN
INTRAMUSCULAR | Status: AC
  Filled 2024-07-06: qty 30

## 2024-07-06 MED ORDER — ONDANSETRON HCL 4 MG/2ML IJ SOLN
INTRAMUSCULAR | Status: AC
  Filled 2024-07-06: qty 2

## 2024-07-06 MED ORDER — PROPOFOL 10 MG/ML IV BOLUS
INTRAVENOUS | Status: AC
  Filled 2024-07-06: qty 20

## 2024-07-06 MED ORDER — SODIUM CHLORIDE 0.9 % IV SOLN: INTRAVENOUS | Status: DC

## 2024-07-06 SURGICAL SUPPLY — 29 items
BENZOIN TINCTURE PRP APPL 2/3 (GAUZE/BANDAGES/DRESSINGS) ×1 IMPLANT
BLADE SURG 15 STRL LF DISP TIS (BLADE) ×1 IMPLANT
BNDG COHESIVE 4X5 TAN STRL LF (GAUZE/BANDAGES/DRESSINGS) ×1 IMPLANT
BNDG COMPR 6X5.8 VLCR NS LF (GAUZE/BANDAGES/DRESSINGS) ×1 IMPLANT
BNDG ELASTIC 4X5.8 VLCR NS LF (GAUZE/BANDAGES/DRESSINGS) ×1 IMPLANT
BNDG ESMARCH 4X12 STRL LF (GAUZE/BANDAGES/DRESSINGS) ×1 IMPLANT
BNDG GAUZE DERMACEA FLUFF 4 (GAUZE/BANDAGES/DRESSINGS) ×1 IMPLANT
CANISTER SUCT 1200ML W/VALVE (MISCELLANEOUS) ×1 IMPLANT
COVER LIGHT HANDLE UNIVERSAL (MISCELLANEOUS) ×2 IMPLANT
DRSG EMULSION OIL 3X3 NADH (GAUZE/BANDAGES/DRESSINGS) ×1 IMPLANT
DURAPREP 26ML APPLICATOR (WOUND CARE) ×1 IMPLANT
ELECTRODE REM PT RTRN 9FT ADLT (ELECTROSURGICAL) ×1 IMPLANT
GAUZE SPONGE 4X4 12PLY STRL (GAUZE/BANDAGES/DRESSINGS) ×1 IMPLANT
GAUZE XEROFORM 1X8 LF (GAUZE/BANDAGES/DRESSINGS) ×1 IMPLANT
GLOVE BIOGEL PI IND STRL 6.5 (GLOVE) ×1 IMPLANT
GLOVE SURG SS PI 6.5 STRL IVOR (GLOVE) ×1 IMPLANT
GOWN STRL REUS W/ TWL LRG LVL3 (GOWN DISPOSABLE) ×2 IMPLANT
KIT TURNOVER KIT A (KITS) ×1 IMPLANT
NDL FILTER BLUNT 18X1 1/2 (NEEDLE) ×1 IMPLANT
NDL HYPO 25GX1 SAFETY (NEEDLE) ×1 IMPLANT
NS IRRIG 500ML POUR BTL (IV SOLUTION) ×1 IMPLANT
PACK EXTREMITY ARMC (MISCELLANEOUS) ×1 IMPLANT
PENCIL SMOKE EVACUATOR (MISCELLANEOUS) ×1 IMPLANT
SOLUTION PREP PVP 2OZ (MISCELLANEOUS) ×1 IMPLANT
SPONGE T-LAP 18X18 ~~LOC~~+RFID (SPONGE) ×1 IMPLANT
STOCKINETTE IMPERVIOUS LG (DRAPES) ×1 IMPLANT
STRIP CLOSURE SKIN 1/4X4 (GAUZE/BANDAGES/DRESSINGS) ×1 IMPLANT
SUT ETHILON 3-0 (SUTURE) IMPLANT
SYR 10ML LL (SYRINGE) ×1 IMPLANT

## 2024-07-06 NOTE — Transfer of Care (Signed)
 Immediate Anesthesia Transfer of Care Note  Patient: Cassie Taylor  Procedure(s) Performed: REMOVAL, HARDWARE (Left)  Patient Location: PACU  Anesthesia Type: General  Level of Consciousness: awake, alert  and patient cooperative  Airway and Oxygen Therapy: Patient Spontanous Breathing and Patient connected to supplemental oxygen  Post-op Assessment: Post-op Vital signs reviewed, Patient's Cardiovascular Status Stable, Respiratory Function Stable, Patent Airway and No signs of Nausea or vomiting  Post-op Vital Signs: Reviewed and stable  Complications: No notable events documented.

## 2024-07-06 NOTE — Anesthesia Postprocedure Evaluation (Signed)
 Anesthesia Post Note  Patient: Cassie Taylor  Procedure(s) Performed: REMOVAL, HARDWARE (Left)  Patient location during evaluation: PACU Anesthesia Type: General Level of consciousness: awake and alert Pain management: pain level controlled Vital Signs Assessment: post-procedure vital signs reviewed and stable Respiratory status: spontaneous breathing, nonlabored ventilation, respiratory function stable and patient connected to nasal cannula oxygen Cardiovascular status: blood pressure returned to baseline and stable Postop Assessment: no apparent nausea or vomiting Anesthetic complications: no   No notable events documented.   Last Vitals:  Vitals:   07/06/24 0845 07/06/24 0855  BP: 132/76 137/77  Pulse: 84 77  Resp: 17 17  Temp:  (!) 36.2 C  SpO2: 94% 97%    Last Pain:  Vitals:   07/06/24 0855  TempSrc:   PainSc: 0-No pain                 Onaje Warne C Ledia Hanford

## 2024-07-06 NOTE — Op Note (Signed)
 FOOT AND ANKLE SURGERY  OPERATIVE REPORT  SURGEON: Dona Klemann G. Meckenzie Balsley, DPM   PRE-OPERATIVE DIAGNOSIS: Painful Hardware, left foot   POST-OPERATIVE DIAGNOSIS: same   PROCEDURE PERFORMED: Hardware removal, left foot  HEMOSTASIS: Tourniquet  ANESTHESIA: IV sedation  ESTIMATED BLOOD LOSS: minimal   PATHOLOGY/SPECIMEN(S):   * No specimens in log *  INDICATIONS FOR PROCEDURE:  [name] is a 83 y.o. female with a with a past surgical history that included internal fixation of the left foot. The osteotomy site/ fracture has since healed and patient recovered from specific injury relatively uneventfully. However, patient has began to note some hardware irritation with exaggerated movements or increased activity evidence of skin patient/prominence noted from hardware backout. As surgical sites have healed and this irritation is now limiting patient ability to wear shoes and keeping her at higher risk for wound complications and superimposed infection, we discussed available conservative treatment options versus removal of hardware. After a discussion of the risks, benefits and alternative treatments patient and DPOA elected to proceed with removal of hardware from the left foot and a consent form was signed.    PROCEDURAL DETAILS: The patient was brought to the operating room and was transitioned to the operatingroom table in the supine position. A standard awake time-out was conducted toconfirm the correct patient, procedure, side, and site. All team members were inagreement. Anesthesia team initiated MAC and administered prophylactic antibiosis.The operative extremity was prepped with alcohol, and 5 ml of 0.5% ropivacaine plain local anesthetic was administered for local block.  Ankle tourniquet was then applied and set to . The extremity was then scrubbed, prepped and draped in the usual aseptic manner. After elevating and exsanguinating the operative extremity, the tourniquet was  inflated to 250 mmHg.  Attention was directed to dorso medial aspect of the left first toe phalangeal joint where palpable prominence was noted indicating hardware irritation.   This area was incised creating access directly over the palpated hardware. Blunt dissection via tenotomy scissors was carried down to the hardware where a screw head was identified. The screw was then extracted, fully intact, and passed to the back table for collection. The incision site was then irrigated copiously with normal saline and tourniquet was released with immediate hyperemic response noted to the operative foot. Screw was intact and accounted for on the back table prior to initiating surgical site closure.  The chronically irritated skin that had formed secondary to screw prominence was then ellipsed from the surgical site to create normal atrophic linear skin closure.  The surgical site was then closed using 3-0 Nylon. A dry sterile dressing was applied consisting of betadine soaked adaptic, 4 x 4s, kling, and ACE bandage.  Fluoroscopy imaging confirmed plantation of hardware creating irritation, with remaining plate and cross screw fixation that appeared without failure left intact.  Overall, the patient tolerated the procedure and anesthesia well. They were transported to the recovery room with vital signs stable and vascular status intact to the operative foot. Once the patient was deemed appropriate by the recovery room staff, the patient was discharged to home in stable condition with the following physical and verbal post-operative instructions (below).   POST-OPERATIVE PLAN:  Activity: WBAT in post-operative shoe / supportive shoe gear. Elevate as much as possible until your post-operative appointment to reduce swelling.  DVT ppx: None indicated for this procedure. 2 weeks: Suture/ staple removal ;  transition to full WBAT in supportive shoe gear.

## 2024-07-06 NOTE — Anesthesia Procedure Notes (Signed)
 Procedure Name: LMA Insertion Date/Time: 07/06/2024 7:45 AM  Performed by: Arliss Hepburn, CRNAPre-anesthesia Checklist: Patient identified, Emergency Drugs available, Suction available, Patient being monitored and Timeout performed Patient Re-evaluated:Patient Re-evaluated prior to induction Oxygen Delivery Method: Circle system utilized Preoxygenation: Pre-oxygenation with 100% oxygen Induction Type: IV induction LMA: LMA inserted LMA Size: 4.0 Number of attempts: 1 Placement Confirmation: CO2 detector, positive ETCO2 and breath sounds checked- equal and bilateral Tube secured with: Tape Dental Injury: Teeth and Oropharynx as per pre-operative assessment

## 2024-07-06 NOTE — Brief Op Note (Addendum)
 BRIEF POSTOPERATIVE NOTE   Cassie Taylor [id number] DOS: 07/06/2024   SURGEON: Yasiel Goyne G. Garmon Dehn, DPM  ASSISTANT(S): none  PRE-OPERATIVE DIAGNOSIS: Painful Hardware, left foot  POST-OPERATIVE DIAGNOSIS: same  PROCEDURE PERFORMED: Hardware removal, left foot    ANESTHESIA: IV sedation    ESTIMATED BLOOD LOSS: minimal   SPECIMEN(S):  none * No order type specified *   COMPLICATIONS: none  CONDITION ON TRANSFER: stable  DISPOSITION: home  Cassie Taylor Cassie Taylor

## 2024-07-06 NOTE — OR Nursing (Signed)
 Nursing order:   You were discharged with a prescription for Doxycycline 100mg  BID.  Please take the full course until completed.

## 2024-07-07 NOTE — H&P (Signed)
 *Requested electronic documentation from paper charting of H&P on 07/06/2024.   Podiatry Clinic History and Physical   Cassie Taylor is a 83 y.o. female  presenting today for surgical intervention to address condition/dx of:    Painful Hardware, left foot   Based on the conservative treatment attempted, R/B/A held at prior appointment, it was agreed upon by patient and provider to undergo the surgical intervention of:   Hardware removal, left foot    - Health status has not changed from the prior clinical encounter.  - Lower extremity concerns as identified previously have not changed from the prior encounter.  No other acute pedal complaints at this time.    PMH:  - Illnesses:       Past Medical History:  Diagnosis Date   Cataract cortical, senile      surgery 07/20/12   Glaucoma     Hyperlipidemia     Hypertension     Osteopenia       - Medications:  Current Outpatient Medications  Medication Instructions   acetaminophen  (TYLENOL ) 650 mg, Oral, Every 6 hours PRN   alendronate (FOSAMAX) 70 mg, Weekly   atorvastatin  (LIPITOR) 10 mg, Daily   Calcium -Magnesium-Vitamin D 500-250-125 MG-MG-UNIT TABS 1 tablet, Daily   citalopram  (CELEXA ) 20 mg, Daily   diltiazem  (DILACOR XR ) 240 mg, Daily   feeding supplement (ENSURE ENLIVE / ENSURE PLUS) LIQD 237 mLs, Oral, 2 times daily between meals   Ferrous Sulfate (IRON) 325 (65 Fe) MG TABS Daily   latanoprost  (XALATAN ) 0.005 % ophthalmic solution 1 drop, Daily at bedtime   lisinopril  (ZESTRIL ) 5 mg, Daily   Melatonin 10 mg, Daily at bedtime   Multiple Vitamin (MULTIVITAMIN) tablet 1 tablet, Daily   Omega-3 2,000 mg, Daily   predniSONE  (DELTASONE ) 5 mg, Daily   QUEtiapine  (SEROQUEL ) 25 mg, Oral, (Dosepack) After supper   Timolol  Maleate, Once-Daily, 0.5 % SOLN 1 drop, 2 times daily   urea (URE-NA) 15 g, 2 times daily     - Allergies:  Allergies  Allergen Reactions   Codeine Nausea And Vomiting    Dizziness     -  Hospitalizations / Operations:  Past Surgical History:  Procedure Laterality Date   CATARACT EXTRACTION W/ INTRAOCULAR LENS IMPLANT Left 07/20/2012   [facility] - Dr Mittie   CATARACT EXTRACTION W/PHACO Right 01/31/2020   Procedure: CATARACT EXTRACTION PHACO AND INTRAOCULAR LENS PLACEMENT (IOC) RIGHT 5.76 00:50.2 11.5%;  Surgeon: Mittie Gaskin, MD;  Location: [facility];  Service: Ophthalmology;  Laterality: Right;   COLONOSCOPY     FOOT SURGERY     HARDWARE REMOVAL Left 07/06/2024   Procedure: REMOVAL, HARDWARE;  Surgeon: Tanda Greig MATSU, DPM;  Location: [facility];  Service: Podiatry;  Laterality: Left;     Issues noted with anesthesia in past: no  Social History:  - Smoking history: none - Housing: house - long term facility  - Familial support: son + daughter in social worker    PHYSICAL EXAMINATION:  BP 137/77   Pulse 77   Temp (!) 97.2 F (36.2 C)   Resp 17   Ht 5' (1.524 m)   Wt 51.3 kg   SpO2 97%   BMI 22.07 kg/m   GENERAL: Awake, conversational. No acute distress.   HENT: Atraumatic, mucous membranes moist, oropharynx clear, no thyromegaly.   EYES: EOMI, PERRLA.   PULMONARY: Breathing easily on room air, no respiratory distress.   NECK: Normal ROM, supple. No C-spine tenderness to palpation.   CARDIAC: Normal  rate. Radial pulses 2+ and symmetric bilaterally.   ABDOMEN: Soft, nondistended, no tenderness to palpation, no guarding.   BACK: No midline tenderness, no CVA tenderness.   EXTREMITIES: Moving all extremities, LE focused exam as below.   SKIN: Warm, dry, no rashes. LE as noted below.   NEURO: A&Ox3, speech clear, symmetrical face, cranial nerves intact, distal strength 5/5 in b/l UE.  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 No crepitus.    Dermatological:  Skin quality: atrophic  No ulcerations, open wounds noted bilaterally.  Interdigital spaces: clean, dry, and intact Hyperkeratotic  focal  tissue noted (with centralized lesion) no active drainage or fluctuance, but noted to prominence underneath skin. Central lesion with serosanguinous drainage - noted on sock again noted today with mild maceration surrounding lesion 05/09/24   ASSESSMENT AND PLAN: Given the inability for conservative treatment to adequately address the patients condition, patient has opted to discuss surgical intervention.  Patient educated about continued conservative care vs. surgical intervention, and the patient has elected for surgical management of this condition (hardware removal). Patient has been advised of the approximate disability period involved for these procedures. In addition, the patient has been advised as to the alternatives of care including but not limited to; continued conservative care as well as surgical procedures (complete hardware removal versus partial - solely irritating screw).  The patient verbalized their understanding of the Moderate risks and complications that could occur, including to but not limited to: numbness, nerve-related issues (i.e. transient neuritis vs. CRPS), pain (acute & chronic), injury to adjacent tissues / vessels / structures, infection, edema, DVT, scar formation, the possible need for additional surgical intervention, as well as potential  anesthesia related complications.  As the prior hardware was placed many years ago it is likely that the fusion was successful, however removal of the screws - if fall had occurred and fractured the site - the other hardware may become problematic at this time - patient and caregiver aware.    Below details some of the information specific to this case that has been discussed and reviewed by both the patient and the provider:  - Patient with lesion and radiographic imaging suggestive of acute hardware failure and back out along with overlying skin lesion / beginning skin breakdown.    [x]  Planned procedure: Hardware removal, left foot  [x]  Imaging Obtained:  Date: 04/25/2024  Type: XR L foot  [x]  Planned WB course: minimize activity x 2 weeks until suture removal - then WBAT in regular supportive shoe gear.  [x]  Additional medical clearance needed:  none - managed by Dr. Vertell outpatient - labs drawn 07/05/24 [x]  Surgical Consent: Both daughter-in-law (DPOA) and patient signed consent forms
